# Patient Record
Sex: Female | Born: 1945 | Race: White | Hispanic: No | Marital: Married | Smoking: Never smoker
Health system: Southern US, Community
[De-identification: ages and names within clinical notes are randomized; demographics above are authoritative.]

## PROBLEM LIST (undated history)

## (undated) DIAGNOSIS — I10 Essential (primary) hypertension: Secondary | ICD-10-CM

## (undated) DIAGNOSIS — E785 Hyperlipidemia, unspecified: Secondary | ICD-10-CM

## (undated) DIAGNOSIS — N319 Neuromuscular dysfunction of bladder, unspecified: Secondary | ICD-10-CM

## (undated) DIAGNOSIS — I509 Heart failure, unspecified: Secondary | ICD-10-CM

## (undated) DIAGNOSIS — N289 Disorder of kidney and ureter, unspecified: Secondary | ICD-10-CM

## (undated) DIAGNOSIS — E119 Type 2 diabetes mellitus without complications: Secondary | ICD-10-CM

## (undated) DIAGNOSIS — IMO0001 Reserved for inherently not codable concepts without codable children: Secondary | ICD-10-CM

## (undated) DIAGNOSIS — E1140 Type 2 diabetes mellitus with diabetic neuropathy, unspecified: Secondary | ICD-10-CM

## (undated) DIAGNOSIS — K219 Gastro-esophageal reflux disease without esophagitis: Secondary | ICD-10-CM

## (undated) DIAGNOSIS — Z860100 Personal history of colon polyps, unspecified: Secondary | ICD-10-CM

## (undated) DIAGNOSIS — I252 Old myocardial infarction: Secondary | ICD-10-CM

## (undated) DIAGNOSIS — F329 Major depressive disorder, single episode, unspecified: Secondary | ICD-10-CM

## (undated) DIAGNOSIS — F32A Depression, unspecified: Secondary | ICD-10-CM

## (undated) DIAGNOSIS — K649 Unspecified hemorrhoids: Secondary | ICD-10-CM

## (undated) DIAGNOSIS — E11319 Type 2 diabetes mellitus with unspecified diabetic retinopathy without macular edema: Secondary | ICD-10-CM

## (undated) DIAGNOSIS — I739 Peripheral vascular disease, unspecified: Secondary | ICD-10-CM

## (undated) HISTORY — PX: SUPRAPUBIC CATHETER PLACEMENT: SHX2473

## (undated) HISTORY — PX: LAPAROSCOPIC CHOLECYSTECTOMY: SUR755

## (undated) HISTORY — PX: SACRAL DECUBITUS ULCER EXCISION: SUR512

## (undated) HISTORY — PX: OTHER SURGICAL HISTORY: SHX169

---

## 2002-11-30 ENCOUNTER — Encounter: Payer: Self-pay | Admitting: Family Medicine

## 2002-11-30 ENCOUNTER — Ambulatory Visit (HOSPITAL_COMMUNITY): Admission: RE | Admit: 2002-11-30 | Discharge: 2002-11-30 | Payer: Self-pay | Admitting: Family Medicine

## 2005-09-17 ENCOUNTER — Ambulatory Visit (HOSPITAL_COMMUNITY): Admission: RE | Admit: 2005-09-17 | Discharge: 2005-09-17 | Payer: Self-pay | Admitting: *Deleted

## 2006-06-24 ENCOUNTER — Inpatient Hospital Stay (HOSPITAL_COMMUNITY): Admission: EM | Admit: 2006-06-24 | Discharge: 2006-07-17 | Payer: Self-pay | Admitting: Emergency Medicine

## 2006-06-24 DIAGNOSIS — M869 Osteomyelitis, unspecified: Secondary | ICD-10-CM | POA: Insufficient documentation

## 2006-06-26 ENCOUNTER — Ambulatory Visit: Payer: Self-pay | Admitting: Internal Medicine

## 2006-06-26 ENCOUNTER — Encounter (INDEPENDENT_AMBULATORY_CARE_PROVIDER_SITE_OTHER): Payer: Self-pay | Admitting: *Deleted

## 2006-07-07 ENCOUNTER — Ambulatory Visit: Payer: Self-pay | Admitting: Physical Medicine & Rehabilitation

## 2006-07-07 ENCOUNTER — Ambulatory Visit: Payer: Self-pay | Admitting: Internal Medicine

## 2006-07-17 ENCOUNTER — Inpatient Hospital Stay (HOSPITAL_COMMUNITY)
Admission: RE | Admit: 2006-07-17 | Discharge: 2006-09-09 | Payer: Self-pay | Admitting: Physical Medicine & Rehabilitation

## 2006-08-07 ENCOUNTER — Ambulatory Visit: Payer: Self-pay | Admitting: Infectious Diseases

## 2006-08-28 ENCOUNTER — Encounter (INDEPENDENT_AMBULATORY_CARE_PROVIDER_SITE_OTHER): Payer: Self-pay | Admitting: Specialist

## 2006-10-21 ENCOUNTER — Ambulatory Visit: Payer: Self-pay | Admitting: Infectious Diseases

## 2006-10-21 DIAGNOSIS — I10 Essential (primary) hypertension: Secondary | ICD-10-CM | POA: Insufficient documentation

## 2006-10-21 DIAGNOSIS — I4891 Unspecified atrial fibrillation: Secondary | ICD-10-CM

## 2006-10-21 DIAGNOSIS — Z9079 Acquired absence of other genital organ(s): Secondary | ICD-10-CM | POA: Insufficient documentation

## 2006-10-21 DIAGNOSIS — Z9089 Acquired absence of other organs: Secondary | ICD-10-CM

## 2006-10-21 DIAGNOSIS — E119 Type 2 diabetes mellitus without complications: Secondary | ICD-10-CM

## 2006-10-21 DIAGNOSIS — A409 Streptococcal sepsis, unspecified: Secondary | ICD-10-CM | POA: Insufficient documentation

## 2006-10-21 DIAGNOSIS — M109 Gout, unspecified: Secondary | ICD-10-CM

## 2006-10-21 LAB — CONVERTED CEMR LAB
AST: 17 units/L (ref 0–37)
Albumin: 3.6 g/dL (ref 3.5–5.2)
Alkaline Phosphatase: 108 units/L (ref 39–117)
BUN: 16 mg/dL (ref 6–23)
CRP: 4.3 mg/dL — ABNORMAL HIGH (ref <0.6)
Calcium: 9.2 mg/dL (ref 8.4–10.5)
Glucose, Bld: 111 mg/dL — ABNORMAL HIGH (ref 70–99)
HCT: 38 % (ref 36.0–46.0)
MCHC: 30.5 g/dL (ref 30.0–36.0)
Platelets: 384 10*3/uL (ref 150–400)
Potassium: 4.9 meq/L (ref 3.5–5.3)
Sodium: 140 meq/L (ref 135–145)
Total Protein: 6.9 g/dL (ref 6.0–8.3)

## 2006-11-05 ENCOUNTER — Telehealth: Payer: Self-pay | Admitting: Infectious Diseases

## 2006-11-11 ENCOUNTER — Ambulatory Visit (HOSPITAL_COMMUNITY): Admission: RE | Admit: 2006-11-11 | Discharge: 2006-11-11 | Payer: Self-pay | Admitting: Infectious Diseases

## 2006-11-25 ENCOUNTER — Ambulatory Visit: Payer: Self-pay | Admitting: Infectious Diseases

## 2006-11-25 LAB — CONVERTED CEMR LAB
MCV: 85.8 fL (ref 78.0–100.0)
Platelets: 417 10*3/uL — ABNORMAL HIGH (ref 150–400)
Sed Rate: 91 mm/hr — ABNORMAL HIGH (ref 0–22)
WBC: 11.7 10*3/uL — ABNORMAL HIGH (ref 4.0–10.5)

## 2006-12-22 ENCOUNTER — Telehealth: Payer: Self-pay | Admitting: Infectious Diseases

## 2006-12-30 ENCOUNTER — Encounter: Payer: Self-pay | Admitting: Infectious Diseases

## 2006-12-31 ENCOUNTER — Ambulatory Visit (HOSPITAL_COMMUNITY): Admission: RE | Admit: 2006-12-31 | Discharge: 2006-12-31 | Payer: Self-pay | Admitting: Infectious Diseases

## 2006-12-31 ENCOUNTER — Encounter: Payer: Self-pay | Admitting: Infectious Diseases

## 2007-01-01 ENCOUNTER — Ambulatory Visit: Payer: Self-pay | Admitting: Infectious Diseases

## 2007-01-01 DIAGNOSIS — L89109 Pressure ulcer of unspecified part of back, unspecified stage: Secondary | ICD-10-CM | POA: Insufficient documentation

## 2007-01-01 LAB — CONVERTED CEMR LAB: Sed Rate: 85 mm/hr — ABNORMAL HIGH (ref 0–22)

## 2007-04-14 ENCOUNTER — Encounter: Payer: Self-pay | Admitting: Infectious Diseases

## 2007-10-26 ENCOUNTER — Telehealth: Payer: Self-pay | Admitting: Infectious Diseases

## 2009-07-05 ENCOUNTER — Ambulatory Visit: Payer: Self-pay | Admitting: Cardiology

## 2010-02-26 ENCOUNTER — Encounter: Admission: RE | Admit: 2010-02-26 | Discharge: 2010-02-26 | Payer: Self-pay | Admitting: Family Medicine

## 2010-06-02 ENCOUNTER — Encounter: Payer: Self-pay | Admitting: Infectious Diseases

## 2010-09-27 NOTE — Discharge Summary (Signed)
Kelsey Williams, Kelsey Williams                ACCOUNT NO.:  [id number]   MEDICAL RECORD NO.:  [id number]          PATIENT TYPE:  INP   LOCATION:  3032                         FACILITY:  [facility]   PHYSICIAN:  Lonia Blood, M.D.       DATE OF BIRTH:  1945-09-15   DATE OF ADMISSION:  06/24/2006  DATE OF DISCHARGE:                         DISCHARGE SUMMARY - REFERRING   INTERIM DISCHARGE SUMMARY:   PRIMARY CARE PHYSICIAN:  Corrie Mckusick, M.D., [place], [place]  [place].   DIAGNOSES:  For a complete list of diagnoses, refer to previously  dictated discharge summary done by Dr. Corky Downs.  In addition  1. Vancomycin-resistant enterococcus urinary tract infection.  2. 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  3. Sacral decubitus stage II.  4. Diskitis.  5. Funguria.  6. Severe protein caloric malnutrition.   MEDICATIONS:  Will be dictated at the time of the actual discharge of  the patient.   DISPOSITION:  Will be dictated at the time of the actual discharge of  the patient.   PROCEDURE:  For a complete list of procedures, refer to the previously  dictated discharge summary done by Dr. Corky Downs.  In addition  1. July 09, 2006, the patient underwent an abdominal x-ray with      findings of improving ileus.  2. July 11, 2006, CT scan of the pelvis was done that did not show      any sacral abscess.  3. July 13, 2006, MRI of the cervical, thoracic and lumbar spine with      findings of dural enhancement compatible with meningitis.  No      epidural fluid collection in the cervical spine.  Stable appearance      of the thoracic spine with diffuse dural thickening and enhancement      compatible with meningitis.  No focal epidural fluid collection in      the thoracic region.  Progressive right paraspinal inflammatory      changes in the lumbar region suggest L4-L5 diskitis, osteomyelitis      as the etiology of the presumed diffuse meningitis;  however, the      findings of the L4-L5 may be degenerative.  No drainable epidural      fluid collection.   HOSPITAL COURSE:  For hospital course covering June 24, 2006, until  July 08, 2006, refer to the previously dictated discharge summary  done by Dr. Corky Downs.   Hospital course from July 09, 2006, until July 14, 2006, during  this period of time [name] has been advanced by increasing her  level of physical activity.  Unfortunately the course of this part of  the hospitalization was complicated by development of a low grade  temperature.  This has been fully evaluated by obtaining a urine culture  as well as rescanning the patient's cervical, thoracic and lumbar spine.  It was noted that the patient's urine culture was positive for  vancomycin-resistant enterococcus and the patient's MRI of the spine was  positive for slight worsening of the paraspinal abscess.  Also, it  was  noted that the patient's dural enhancement on the MRI of the cervical,  thoracic and lumbar spine has persisted.  The patient's antibiotics were  adjusted at the discretion of the infectious disease consultant and  currently the patient is on Zyvox.  The plans are to switch the patient  to vancomycin intravenously and leave the patient on vancomycin for at  least six months.   PROBLEM #2 -  UNCONTROLLED DIABETES MELLITUS TYPE 2:  [name]'  Lantus and NovoLog was adjusted as needed during this period of  hospitalization to achieve good control.   PROBLEM #3 -  SEVERE LOWER EXTREMITY WEAKNESS AND DECONDITIONING  SECONDARY TO THE DURAL PROCESS:  [name]' physical therapy and  activity level was increased as tolerated and the patient is working  towards getting to the inpatient rehab floor.   PROBLEM #4 -  SACRAL DECUBITUS:  On July 08, 2006, it was noted that  the patient may have skin breakdown.  Further evaluation revealed a  stage II 11 x 6 cm pressure ulcer with a 60% area of  necrosis.  An  immediate CT scan of the pelvis was obtained which did not show an  abscess formation.  Patient was placed on a [place] bed and specialized  local wound care was obtained.      Lonia Blood, M.D.  Electronically Signed     SL/MEDQ  D:  07/14/2006  T:  07/14/2006  Job:  [id number]   cc:   Kelsey Williams, M.D.  Corrie Mckusick, M.D.  Nanetta Batty, M.D.

## 2010-09-27 NOTE — Consult Note (Signed)
NAMEKRISSIA, Kelsey Williams                ACCOUNT NO.:  [id number]   MEDICAL RECORD NO.:  [id number]          PATIENT TYPE:  INP   LOCATION:  3032                         FACILITY:  [facility]   PHYSICIAN:  Melissa L. Ladona Ridgel, MD  DATE OF BIRTH:  [birth year]   DATE OF CONSULTATION:  07/13/2006  DATE OF DISCHARGE:                                 CONSULTATION   REASON FOR PRESENTATION:  Assist with pain control.   REFERRING PHYSICIAN:  Lonia Blood, M.D.   CASE SUMMARY:  The patient is a 65 year old white female transferred  from [facility] in atrial fibrillation with rapid ventricular  response.  The patient had had a four-day history of worsening lower  back pain for which she was diagnosed and treated for a UTI.  After  starting oral antibiotics as an outpatient, the patient presented to  [facility] with worsening lower back pain and was found to be  in atrial fibrillation.  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  The  patient has been seen by cardiology for a non ST elevation MI as a  source for atrial fibrillation with rapid ventricular response.  She has  been treated appropriately and now is in normal sinus rhythm.  She was  evaluated by Dr. Newell Coral in infectious disease with regard to her  epidural inflammation and is being treated with IV antibiotics.  The  course is further complicated by ileus, diabetes with intermittently  poor control blood sugars.  Palliative medicine was asked to consult  regarding her pain management.   REVIEW OF SYSTEMS:  Pain in the lower back 4/5, currently described as  slicing in nature.  She does not have nausea or vomiting.  She had been  falling prior to admission and continues to feel weak.  She had no  recent weight loss or weight gain.   PAST MEDICAL HISTORY:  Insulin-dependent diabetes.   PAST SURGICAL HISTORY:  1. Umbilical herniorrhaphy.  2. Cholecystectomy in 2001 and partial hysterectomy.   SOCIAL HISTORY:  The patient lives with her husband and daughter.  She  does not smoke.  She does not drink.   FAMILY HISTORY:  Reviewed and is noncontributory.   MEDICATIONS:  Medications during this hospital stay:  1. Miconazole per vagina 2%.  2. Meperidine nasal daily.  3. Aspirin 325 mg daily.  4. Protonix 40 mg daily.  5. Lovenox 40 mg q.24h.  6. Lopressor 100 mg daily and 50 mg at bedtime.  7. Sliding scale insulin.  8. Irbesartan 150 mg daily.  9. Diflucan 100 mg daily.  10.Protein powder.  11.Fentanyl  to her wound.  12.Hydromorphone 0.5 mg IV q.4h p.r.n. as well as an order for 2 to 4      mg IV q.4 h. p.r.n.  13.Lantus 30 units q.12h.  14.Ceftriaxone 2 g IV q.12h.  15.Percocet one q.6h. p.r.n.   PHYSICAL EXAMINATION:  VITAL SIGNS:  Temperature 98.1, blood pressure  128/71, pulse  67, respirations 16, saturation 94%.  Blood sugar 108.  GENERAL:  This is a pleasant white female in no acute distress.  HEENT:  She is normocephalic, atraumatic.  Pupils are equal, round and  reactive to light.  Extraocular muscles were intact.  Mucous membranes  are moist.  NECK:  Supple. There is no JVD, no lymph nodes, no carotid bruits.  She  does have some tenderness in the posterior cervical spine area on  palpation and mild muscular tension.  CHEST:  Decreased but clear.  CARDIOVASCULAR:  Regular rate and rhythm.  Positive S1 and S2.  No S3 or  S4.  No murmurs, rubs or gallops.  ABDOMEN:  Soft, nontender, nondistended with positive bowel sounds with  mild obesity.  BACK:  Her sacral wound was dressed and was not examined during the this  visit.  The wound care evaluation was reviewed.  EXTREMITIES:  Shows scabs over the dorsum of her left foot and right  plantar area.   LABORATORY DATA:  CT scans were reviewed.  The MRI from today is  pending.  Her most recent CBC showed  white count of hemoglobin 10,  hematocrit 29, platelets 351,000.  Her most recent sodium was 130,  potassium 3.9, chloride 93, CO2 29, BUN 4, creatinine 0.31.  Her EXR is  139.  Urinalysis showed large leukocyte esterase and evidence for  infection.  Cultures are pending.  Most recent CT scan of the pelvis  showed degenerative changes in the lumbar spine.  No evidence for sacral  abscess.  MRSA was pending.   ASSESSMENT/PLAN:  The patient is a 65 year old white female admitted  with atrial fibrillation and back pain, found to have group B strep  bacteremia with lumbar epidural inflammatory process.  No formal abscess  is noted.  Her course has been complicated by atrial fibrillation during  this stay, with sacral decubitus as well as increasing pain.  Palliative  was asked to assist with her discomfort.   1. Pain control.  I would start a long-acting MS Contin agent at 15 mg      p.o. q.12h. and up titrate for better basilar control as needed.      For her acute pain at this time, I would continue Dilaudid for      breakthrough and attempt to convert that over to oral medications      as her pain is better controlled.  The patient is not sleeping and      the pain is worse at night.  I would add amitriptyline 50 mg at      bedtime.  I would consider adding Lyrica if the pain persists and      has more of a pain patch neuropathic quality.  Standing doses of      Tylenol may have significant effects on her basilar pain control,      I, therefore, would use that every four hours and discontinue her      Percocet.  2. Avoid constipation by adding Colace and using Dulcolax as needed.   We will reevaluate her pain in the a.m. and titrate medications  appropriately.   The total time spent with this patient was from 3:25 to 4:25.      Melissa L. Ladona Ridgel, MD  Electronically Signed     MLT/MEDQ  D:  07/16/2006  T:  07/16/2006  Job:  [id number]   cc:   Lonia Blood, M.D.

## 2010-09-27 NOTE — H&P (Signed)
NAMERIVERS, GASSMANN NO.:  [id number]   MEDICAL RECORD NO.:  [id number]          PATIENT TYPE:  INP   LOCATION:  1843                         FACILITY:  [facility]   PHYSICIAN:  Ulyses Amor, MD DATE OF BIRTH:  02/01/1946   DATE OF ADMISSION:  06/24/2006  DATE OF DISCHARGE:                              HISTORY & PHYSICAL   Kelsey Williams is a 65 year old white woman who was transferred from [facility] to [facility]  for further management of atrial  fibrillation with a rapid ventricular rate.  The patient, who has no  past history of cardiac disease, was started on medication for her  urinary tract several days ago.  In the intervening time, she has  experienced muscle spasms in her lower back.  She developed worsening  muscle spasm today and presented to the emergency department at [facility].  There she was found to be in atrial fibrillation with a  rapid ventricular rate.   As noted, the patient has no past history of cardiac disease, including  no history of chest pain, myocardial infarction, coronary artery  disease, congestive heart failure, or arrhythmia.  Her only risk factors  for coronary artery disease include hypertension and diabetes mellitus.  She reports no dizziness, lightheadedness, palpitations, syncope, or  near syncope.  Nor does she note any chest pain, tightness, heaviness,  pressure, or squeezing. The patient's only other medical problem is  gout.   MEDICATIONS:  Skelaxin, Cipro, oxycodone, acetaminophen.   ALLERGIES:  None.   OPERATIONS:  Appendectomy, cholecystectomy, hysterectomy.   SOCIAL HISTORY:  The patient lives with her husband and youngest  daughter.  She neither smokes cigarettes nor drinks alcohol.   FAMILY HISTORY:  Noncontributory.   REVIEW OF SYSTEMS:  No problems related to her head, eyes, ears, nose,  mouth, throat, lungs, gastrointestinal system, genitourinary system, or  extremities.   There is no history of neurological or psychiatric  disorder.  There is no history of fever, chills, or weight loss.   PHYSICAL EXAMINATION:  VITAL SIGNS:  Blood pressure 143/74, pulse 137  and irregularly irregular, respirations 18, temperature 97.2.  GENERAL:  The patient was an obese, middle aged white woman in no  discomfort.  She was alert, oriented, and responsive.  HEENT:  Normal.  NECK:  Without thyromegaly or adenopathy.  Carotid pulses were palpable  bilaterally and without bruits.  CARDIAC:  Irregularly irregular rhythm.  There was no murmur, rub,  gallop, or click.  CHEST:  No chest wall tenderness was noted.  LUNGS:  Clear.  ABDOMEN:  Soft, nontender.  There was no mass, hepatosplenomegaly,  bruit, distention, rebound, guarding, or rigidity.  Bowel sounds were  normal.  RECTAL/GENITAL:  Not performed as they were not pertinent to the reason  for this consultation.  EXTREMITIES:  Without edema, deviation, or deformity.  Radial and  dorsalis pedis pulses were palpable bilaterally.  NEUROLOGIC:  Brief  screening neurological survey was unremarkable.   LABORATORY DATA:  Electrocardiogram revealed atrial fibrillation with a  ventricular rate of 136 beats per minute.  There were nonspecific ST-T  wave changes, possibly related to the rate.  The chest radiograph report  was pending at that time of this dictation.  The initial CK was 171 with  a CK-MB of 7.4, relative index 4.3, and troponin 0.23.  Potassium was  2.6, BUN 17, creatinine pending.  White coun5 20.8, hemoglobin 12.8,  hematocrit 38.3.  The remaining studies were pending at the time of this  dictation.   IMPRESSION:  1. Atrial fibrillation with a rapid ventricular rate  2. Rule out myocardial infarction. Troponin 0.23.  3. Hypertension.  4. Diabetes mellitus.  5. Gout.  6. Recent urinary tract infection.   PLAN:  1. Telemetry.  2. Serial cardiac enzymes.  3. Aspirin.  4. Intravenous heparin.  5. Thyroid  function tests.  6. Echocardiogram.  7. IV diltiazem for rate control.  8. Further measures per Dr. Jacinto Halim.      Ulyses Amor, MD  Electronically Signed     MSC/MEDQ  D:  06/25/2006  T:  06/25/2006  Job:  [id number]   cc:   Cristy Hilts. Jacinto Halim, MD

## 2010-09-27 NOTE — Discharge Summary (Signed)
NAMEJARYIAH, Kelsey Williams                ACCOUNT NO.:  [id number]   MEDICAL RECORD NO.:  [id number]          Kelsey Williams TYPE:  IPS   LOCATION:  4003                         FACILITY:  [facility]   PHYSICIAN:  Ranelle Oyster, M.D.DATE OF BIRTH:  August 18, 1945   DATE OF ADMISSION:  07/17/2006  DATE OF DISCHARGE:  09/09/2006                               DISCHARGE SUMMARY   DISCHARGE DIAGNOSES:  1. Cervical to sacral spine infection with dural thickening and      enhancement around thoracic spine with interval destruction of      facet joints L3-4, L4-5, suggesting septic arthritis.  2. Sacral decubitus ulcer, I&D'd.  3. Diabetes mellitus type 2.  4. Hypertension.  5. Severe protein depletion malnutrition.  6. Paroxysmal atrial fibrillation.  7. Bilateral foot ulcers, resolved.  8. Pain control, much improved.   HISTORY OF PRESENT ILLNESS:  Kelsey Williams is a 65 year old female  with history of DM type 2, recent UTI, admitted to [facility]  February 13 with lower back pain due to muscle spasms and atrial  fibrillation with RVR.  Blood sugar was 449 at admission.  Kelsey Williams  was evaluated by Dr. Jacinto Halim.  Cardiac enzymes noted to be elevated.  Kelsey  Kelsey Williams was started on IV heparin for NSTEMI.  Kelsey Williams also noted to  have leukocytosis and chronic foot ulcers with blood cultures being  positive for group B strep.  Kelsey Williams developed colonic ileus  requiring n.p.o. status and treatment with Reglan.  Infectious disease  was consulted for input and MRI of back recommended for full workup.  This revealed abnormal enhancement, L2, L3, with bilateral pulmonary  process.  Antibiotics were changed to Rocephin.  Dr. Lajoyce Corners was consulted  for I&D of bilateral foot ulcers on February 20.  Dr. Newell Coral consulted  for input on spine infection and felt Kelsey Williams would require  antibiotics greater than 6 months.  [facility] consulted for input on  ileus and has been following along.  Kelsey Williams's  diet has slowly been  advanced and Kelsey Williams has been tolerating p.o. intake.  Kelsey Williams  developed a sacral decubitus ulcer 11 x 6 cm without any improvement,  was started on Santyl and pulse lavage on March 4.  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  Follow-up MRI on March 3 showed progress in right paraspinal  with inflammatory discitis, osteomyelitis, L4, L5.  Currently Kelsey  Kelsey Williams continues with diaphoresis with poor mobility.  Noted to have  issues with pain management and palliative care following and adjusting  pain medications.  She is noted to have depressed mood and most recent  complaints of headache.  ID recommends Zosyn through March 9, then  changing over to Rocephin through April 2 for 4 weeks of antibiotic  therapy.  Currently due to impairments in mobility and self-care, we are  consulted for further therapies.   PAST MEDICAL HISTORY:  1. DM type 2.  2. Diabetic foot  ulcers.  3. hypertension.  4. Gout.  5. A hysterectomy.  6. Appendectomy.  7. Cholecystectomy.  8. Umbilical hernia repair.  9. Recent falls x4.  10.Neuropathy.   ALLERGIES:  ACE.   FAMILY HISTORY:  Positive for diabetes and coronary artery disease.   SOCIAL HISTORY:  Kelsey Williams is married and lives in a 1-level home with  4-5 steps to entry.  Was independent prior to admission.  Does not use  any tobacco or alcohol.   HOSPITAL COURSE:  Kelsey Williams was admitted to rehab on July 17, 2006, for inpatient therapies to consist of PT/OT daily.  Past admission  she was maintained on Zyvox through March 9 and then we changed it over  to IV Rocephin.  Pulse lavage was continued daily for sacral decubitus  management and an air mattress has been maintained for pressure relief  measures.  Secondary to complaints of headaches that were reported to be  new, MRI of Kelsey brain  was done past admission on March 8.  This showed  no acute intracranial abnormality, age-advanced atrophy with mild white  matter changes likely due to sequelae of chronic microvascular ischemia.  Weekly CBCs have been done per Dr. Earl Gala input as well as weekly  ESR.  Kelsey Williams's white counts have been variable.  Initially they did  drop down from 15 to 11.  Currently most recent CBC of April 28 shows  increase at 18.5.  Kelsey Williams did develop anemia with a drop in her H&H  to 8.5 on April 10.  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  She was transfused with 2 units of packed red blood  cells on August 31, 2006.  Most recent CBC of April 28 reveals H&H of  11.0 and 33.4, platelets at 690.  Follow-up MRI of lumbar, thoracic,  cervical spine done on March 28.  This showed progression in amount of  dural thickening and enhancement beginning at C4-5, extending all Kelsey  way to Kelsey lumbosacral junction, causing some compression of spinal  cord, particularly at C7-T1 and now with some bone marrow edema and  enhancement of posterior vertebral bodies at C6 and C7 and all of  thoracic and lumbar vertebrae, probably due to Kelsey active edema from  infection and or infection of Kelsey bone, progression of enhancement of  paraspinal soft tissues, significant progression of circumferential and  dural thickening and enhancement of Kelsey entire thoracic spine, and no  epidural abscess seen.  Progression of dural thickening and enhancement  throughout lumbar spine with enhancement of Kelsey posterior vertebral body  in lumbar spine with some interval destruction of facet joints at right  L3-4, L4-5, suggesting septic arthritis, question early epidural  abscess, posterior L4-5, but most likely represents thickened ligaments.  There was diffuse myositis on posterior paraspinous muscle.  A right psoas muscle abscess had improved from prior study.  Radiology  questioned whether this  was an atypical infection such as fungus or  whether this had not been adequately treated with antibiotics.  Dr.  Newell Coral was consulted for input and recommended getting input from ID  as Kelsey radiologist believed Kelsey Williams appeared to have a progressed  infection by MRI despite 6 weeks of antibiotics.  ESR without  improvement at 110.   ID has been following up on Kelsey Williams.  Dr. Orvan Falconer evaluated Kelsey  Kelsey Williams and felt as Kelsey Williams has improved clinically and  microbiologically on treatment for group B  strep bacteremia with  vertebra infection.  He felt that Kelsey Williams needs to continue on  Rocephin and anticipates that her MRI would be abnormal long after Kelsey  infection is cleared.  He recommended continuing antibiotics for 8-12  weeks total plus-minus additional course of p.o. antibiotics such as  amoxicillin in Kelsey future.  Kelsey Williams has improved neurologically with  improvement in lower extremity strength.  Her fever curve has been  variable.  Kelsey Williams had had some fever spikes of 101.  Blood  cultures, urine cultures done, and urine cultures continued to show VRE.  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  Rocephin was increased to  2 g IV daily on April 15.  Kelsey Williams's sacral decubitus has been  managed with use of hydrotherapy 5 days a week in addition to Accuzyme  for surgical debridement and some local debridement at site.  Kelsey  Kelsey Williams has had some improvement, however was noted to have some  increase in drainage and increase in ischemic areas around edges.  General surgery was consulted for input regarding I&D.  Kelsey Williams was  taken to OR for debridement of necrotic tissue on April 18 by Dr.  Zachery Dakins.  Wound cultures grew out Streptococcus viridans.  Kelsey  Kelsey Williams's fever curve was noted to be trending down at this surgery.  Kelsey Williams was changed over to IV Primaxin 500 mg q.8h. on April  19 by  Dr. Roxan Hockey.  Currently sacral decubitus is treated with Monday,  Wednesday, Friday Wound-V.A.C. with hydrotherapy prior to re-application  of V.A.C.  Local bedside I&D was done additionally on April 28 by Dr.  Gerrit Friends.  They felt no further debridement is needed at present as no  odor, no drainage, and sacral decubitus revealing only granulation site.   Initially pain control was an issue.  Kelsey Williams was on MSIR as well as  fentanyl patch.  She did have excessive sedation secondary to these  medications.  A narcotic holiday was carried out with improvement in Kelsey  Kelsey Williams's mental status.  As mental status improved, pain medicines were  slowly added back on.  Currently Kelsey Williams's pain is well-managed with  Kelsey use of Tylenol 500 mg q.6h., with OxyIR 5-10 mg q.4-6h. p.r.n. pain.  Kelsey Williams's p.o. intake was noted to be poor.  Megace was added with much improvement in her appetite.  With improvement in her appetite,  blood sugar was noted to be on an upward trend, 140s to 290s.  Currently  Kelsey Williams's glyburide is increased to 10 mg a day with Glucophage  continuing at 850 mg in Kelsey a.m. and 500 mg in Kelsey p.m.  Protein  supplements are being used on a b.i.d. basis in addition to Juven b.i.d.  for low albumin stores and to help assist with wound healing.  Kelsey  Kelsey Williams is noted to have neurogenic bowel with minimal rectal tone and  is noted to be incontinent.  A bowel program has continued to be  adjusted to help with this.  Currently Kelsey Williams's bowel program has  been changed to Fibercon two tablets at 10 p.m., Dulcolax suppository  daily at 5 a.m. followed by digital stimulation to help empty.  Lactinex  is being used one packaged b.i.d. secondary to current antibiotic use.  Vitamin C 500 mg b.i.d. as well as zinc sulfate 220 mg b.i.d. was added  additionally for wound healing.  IV Primaxin continues currently at 500  mg q.8h.  ID recommends 4 weeks of imipenem, which  would be  approximately through May 19.  They also recommend changing Kelsey Williams  over to ertapenem 1 g per day for ease of care.  Kelsey Williams to follow up  with ID past discharge regarding further input on treatment of infection  past antibiotic completed.  Dr. Newell Coral has been contacted prior to  discharge and felt Kelsey Williams not a surgical candidate, does not need  surgery currently.  She could be managed on outpatient basis by  infectious disease when put on antibiotic as well as primary care for  medical issues.   Most recent labs on April 28 reveal sodium 138, potassium 4.6, chloride  107, CO2 25, BUN 11, creatinine 0.34, glucose 155, albumin 2.3.  LFTs  within normal limits.  Blood pressures checked on b.i.d. basis have been  normal from 120s to a rare high at 140 systolic, 60s to 16X diastolic.  Blood sugars from 140s to high 200 range.  Foley remains in place  secondary to sacral decubitus ulcer.  Therapies have been ongoing and  Kelsey Williams's participation and mood have improved greatly.  She was  started on Lexapro 5 mg, and this has increased to 10 mg a day.  Kelsey  Kelsey Williams's motivation as well as overall outlook has greatly improved.  Currently Kelsey Williams is at minimum assist for toileting.  She is able  to tolerate bathing, dressing at head of bed with setup at minimum  assist.  Requires minimum to maximum assist with ADL's.  She is able to  stand for a total of 90 seconds on standard walker without break with  focus on standing balance, safety and awareness and functional strength.  She is able to transfer to a wheelchair and other level surfaces with  scoot, pivot transfers at minimum assist.  Kelsey Williams continues to lack  active knee extension bilaterally, left being greater than right.  She  is currently able to transfer wheelchair with supervision at 450 feet.  Speech therapy has also been working on Kelsey Williams for cognitive issues. Kelsey Williams's base is at 20% accuracy,  minimum assist for simple daily  math problems.  Kelsey Williams is 100% accurate for complex-level  conversation, independent for verbal expression.  Reading comprehension  is intact and writing expression is functional.  Supervision level for  emergent awareness.  Supervision to independent for integrated  functional tasks.  Requiring minimum assist for complex planning tasks.  Kelsey Williams will continue to require further progressive PT/OT past  discharge.  Kelsey Wound-V.A.C. will continue with hydrotherapy 3 times a  week with V.A.C. changes.  Continue pressure relief measures with air  mattress for healing of wound and continue Foley to straight drain until  sacral decubitus healed up.   DISCHARGE MEDICATIONS:  1. Lexapro 10 mg p.o. daily.  2. Glyburide 10 mg p.o. daily.  3. Lopressor 100 mg b.i.d.  4. K-Dur 20 mEq a day.  5. Multivitamin once a day.  6. Coated aspirin 325 mg a day.  7. Eucerin cream to bilateral feet b.i.d.  8. Protonix 40 mg a day.  9. Lantus insulin 30 units subcu q.12h.  10.Tylenol 500 mg at 6 a.m., 1200 hours, 1800 hours, and 2400 hours.  11.Glucophage 850 mg in Kelsey a.m., 500 mg in p.m.  12.Protein powder supplements b.i.d.  13.Juven nutritional drink b.i.d.  14.Lactinex one package b.i.d.  15.Fibercon two tablets p.o. at 2200 hours.  16.Megace 400 mg p.o. b.i.d.  17.Vitamin C 500 mg b.i.d.  18.Zinc sulfate 220 mg b.i.d.  19.Glyburide 10 mg p.o. b.i.d.  20.Primaxin 500 mg IV q.8h. or ertapenem 1 g IV daily.  21.OxyIR 5-10 mg q.4-6h. p.r.n. pain.  22.Maalox Plus 30 mL p.o. q.4h. p.r.n.  23.Dulcolax suppository q.a.m. at 5 a.m.  24.Ambien 5 mg q.h.s. p.r.n.  25.Phenergan 12.5 mg q.6h. p.r.n.   DIET:  Carb-modified medium.   SPECIAL INSTRUCTIONS:  1. Progressive PT/OT to continue past discharge.  2. Continue monitoring diabetes with a.c. and h.s. CBG checks and      sliding scale insulin per protocol for elevated blood sugars.  3. Continue pressure relief  measures with air mattress.  4. Continue Foley to straight drain for now.  5. Continue wound care management with Wound-V.A.C. changes Monday,      Wednesday, Friday with hydrotherapy.  Would not require pulse      lavage.  For any changes or further changes in output, appearance,      and increasing of necrotic tissue, please contact general surgery      for debridement.   FOLLOW-UP:  Kelsey Williams to follow up with LMD for medical management.  Follow up with infectious disease for input regarding further antibiotic  treatment, changing over to p.o. antibiotics, as well as Kelsey need for  repeat x-rays and labs in Kelsey future.  Follow up with Dr. Riley Kill as  needed.      Greg Cutter, P.A.      Ranelle Oyster, M.D.  Electronically Signed    PP/MEDQ  D:  09/08/2006  T:  09/08/2006  Job:  [id number]   cc:   Lacretia Leigh. Ninetta Lights, M.D.  Nadara Mustard, MD  Cristy Hilts. Jacinto Halim, MD  Anselm Pancoast. Zachery Dakins, M.D.  Hewitt Shorts, M.D.

## 2010-09-27 NOTE — Consult Note (Signed)
[name], Williams                ACCOUNT NO.:  [id number]   MEDICAL RECORD NO.:  [id number]          PATIENT TYPE:  IPS   LOCATION:  4003                         FACILITY:  [facility]   PHYSICIAN:  Hewitt Shorts, M.D.DATE OF BIRTH:  08-06-1945   DATE OF CONSULTATION:  08/07/2006  DATE OF DISCHARGE:                                 CONSULTATION   NEUROSURGERY CONSULTATION:   HISTORY OF PRESENT ILLNESS:  The patient is a 65 year old white female  who was admitted about 6 weeks ago with a number of significant medical  problems.  Patient was seen in neurosurgery consultation early in that  admission and this is a reconsultation at the request of Dr. Faith Rogue.   Patient was admitted with an acute subendocardial myocardial infarction  with atrial fibrillation with rapid ventricular response, a history of  diabetes mellitus with profound hyperglycemia at the time of  hospitalization and complaints of low back pain.   The patient was also noted to have nonhealing diabetic foot ulcers.  Her  blood sugar was greater than 400, her white blood cell count was about  25,0000.  Blood cultures were performed and grew out group B strep.   MRI of the lumbar spine was obtained, which revealed diffuse epidural  infection and further MRIs were obtained of the cervical and thoracic  spine, which showed the infectious process extending through the  thoracic and lumbar regions and the inflammatory changes extending up  into the cervical region.  However, distinct abscess was not revealed.   Antibiotic therapy had been initiated.  The patient was seen in  infectious disease consultation and treatment for the infectious process  was suggested by the infectious disease service and she is continued to  be followed by them.   The patient was seen in neurosurgical consultation, but it was not felt  that there was a role for neurosurgical intervention because of the  diffuse distribution infection  and the lack of a focal abscess.   The patient has been treated over the past 6 weeks or so and her white  blood cell count decreased at one point to a normal range of about 8.5,  but is now most recently 12.6 on March 27.  However, sed rate has  remained elevated and the most recent sed rate on March 24 was 110.   The direction and supervision of the treatment of her infection has been  deferred to the expertise of the infectious disease service.   Patient was subsequently transferred from the incompass hospitalist  service to the rehabilitation service where she is now under the care of  Dr. Faith Rogue.   Patient was restudied today with MRI scans of the cervical, thoracic and  lumbar spine, all done without and with Gadolinium.  This was reviewed  with Dr. Elie Goody and Dr. Francene Boyers and the study is felt by them  to show progression of the process with diffuse thickening of the dura  extending from the C5 level to the sacrum throughout the lower cervical,  the entire thoracic and the entire lumbar region  with inflammation  extending into the posterior aspect of the adjacent vertebral bodies  from the cervical spine to the sacrum, as well as into the paraspinal  facial tissues.   In speaking with the patient, she reports some discomfort, but overall  feels that she is less uncomfortable.  She describes some discomfort in  her neck, in her mid back, as well as in her low back.  She continues to  undergo rehabilitation and therapies.   It is reported that she can transfer, with assistance, from bed to chair  and can stand with a walker briefly.   Neurosurgical reconsultation was requested by Dr. Faith Rogue who is  also contacting Dr. Cliffton Asters from the infectious disease service  and reported to him the MRI findings and his requested further  infectious disease service followup.   EXAMINATION:  GENERAL:  Patient is a white female, in no acute distress,   sitting in a wheelchair in the physical therapy gym.  VITAL SIGNS:  Temperature is 97.  Pulse 95.  Blood pressure 144/82.  MOTOR EXAMINATION:  Shows 5/5 strength through the upper extremities in  the deltoid, biceps, triceps, intrinsic grip bilaterally.  The iliopsoas  the left is 4- to 4, the right is 4-.  The quadriceps, the left is 4+,  the right is 4.   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  Sed rate has not improved.   RECOMMENDATIONS:  At this time, there remains no indication for  neurosurgical intervention.  The process is quite diffuse extending from  the mid cervical level to the sacrum and there does not appear to be  focal abscess collection.   However, I do agree with infectious disease service consultation  followup.  They and/or the medical service may need to reevaluate the  overall nature of this infectious process, which may require further  study, continued therapy and/or adjustments to the therapy and this will  be deferred to the infectious disease and medical service.      Hewitt Shorts, M.D.  Electronically Signed     RWN/MEDQ  D:  08/07/2006  T:  08/08/2006  Job:  [id number]

## 2010-09-27 NOTE — Consult Note (Signed)
Kelsey Williams, GRADILLAS                ACCOUNT NO.:  [id number]   MEDICAL RECORD NO.:  [id number]          PATIENT TYPE:  INP   LOCATION:  1843                         FACILITY:  [facility]   PHYSICIAN:  Kelsey Williams, M.D.DATE OF BIRTH:  29-May-1945   DATE OF CONSULTATION:  DATE OF DISCHARGE:                                 CONSULTATION   PRIMARY CARE PHYSICIAN:  Dr. Phillips Williams in [place].   REFERRING PHYSICIAN:  Dr. Jenne Williams.   REASON FOR REFERRAL:  Management of fever, body aches, and leukocytosis.   HISTORY OF PRESENTING COMPLAINT:  Kelsey Williams is a 65 year old Caucasian  female with a history of diabetes mellitus.  She started becoming sick  about 4 days ago when she became unsteady on her feet and fell about 4  times in one day.  She eventually went to her PCP yesterday, and she was  treated for a urinary tract infection, started on ciprofloxacin.  Later  yesterday evening, she went to the emergency room at [facility].  She was found to be in atrial fibrillation with rapid  ventricular response and had elevated troponin, hence she was sent over  to [facility] for cardiology evaluation.  The patient has been  having fever, chills, and body aches for about 4 days.  We are being  asked to evaluate.   She denies dysuria, urgency, or increased frequency of micturition,  although her UA was somewhat positive for urinary tract infection.  She  does have a cough, and she attributes this to Lisinopril.  There is no  chest pain.  Shortness of breath has increased from baseline.  She also  has complained of abdominal pain which is generalized.  No vomiting but  she has been nauseous.  There is no diarrhea or constipation.  There is  no sore throat or history of contact with somebody that is sick at home.  The patient has been able to tolerate her food.   REVIEW OF SYSTEMS:  As in the HPI.  In addition, the patient denies  headaches or change in vision, no focal weakness.   She has been having  some blisters on her skin which she has been picking.   PAST MEDICAL HISTORY:  Diabetes mellitus.   PAST SURGICAL HISTORY:  1. Appendectomy.  2. Cholecystectomy.  3. Partial hysterectomy.  4. Umbilical herniorrhaphy.   CURRENT MEDICATIONS:  1. Glyburide and metformin 5/500 p.o. b.i.d.  2. Ciprofloxacin which was recently started yesterday.  3. Aspirin 325 mg daily.  4. Ciprofloxacin 500 mg b.i.d.  5. Heparin infusion.  6. NovoLog sliding scale.  7. Skelaxin 800 mg t.i.d.  8. Lopressor 25 mg t.i.d.  9. IV fluids normal saline at 100 cc/hr.  10.Cardizem infusion.   ALLERGIES:  NO KNOWN DRUG ALLERGIES.   FAMILY HISTORY:  Noncontributory.   SOCIAL HISTORY:  The patient does not smoke cigarettes or drink alcohol.  She is retired from [employer].   PHYSICAL EXAMINATION:  VITAL SIGNS:  Currently, blood pressure 127/71,  temperature of 97.5, pulse of 87, respiratory rate of 20, O2 sat of 98%.  GENERAL:  The patient is awake, alert, oriented in time, place, and  person.  Not in respiratory distress.  HEENT:  Normocephalic atraumatic head.  Not icteric.  Not pale.  Mucous  membranes moist.  There is thrush.  No elevated JVD.  No carotid bruit.  LUNGS:  No wheeze or rhonchi.  CV:  S1 S2 irregular.  No murmur.  ABDOMEN:  Obese.  Slightly distended.  Soft.  There is diffuse  tenderness with voluntary guarding.  Bowel sounds present.  No palpable  organomegaly.  EXTREMITIES:  With 1+ pitting, pedal edema.  No calf tenderness.  SKIN:  Multiple skin lesions/infection on anterior abdominal wall and  lower extremities.  She has callus on the sole of her left foot.  CNS:  No focal neurological deficit.   INITIAL LABORATORY DATA:  Blood culture drawn yesterday at 1605 hours,  was positive for gram positive cocci in chains.  A second set of blood  cultures was negative.  She has had elevated troponin and CK-MB with  first set.  Her last set troponin 0.43, a CK-MB  of 75, relative index of  5.7, and a creatinine kinase of 1.322.  Urinalysis showed moderate  blood, urine glucose greater than 1,000.  Microscopy showed many  bacteria with 0-2 white blood cells, uric acid crystals present.  Fasting lipid profile:  Total cholesterol 134, HDL 25, LDL 82.  Hemoglobin A1c 13.5.  Sodium 126, potassium 3.4, chloride 99, CO2 14,  glucose 449, BUN 27, creatinine 0.96.  Total bilirubin 1.5, alkaline  phosphatase 163.  AST 68, ALT 52, total protein 6, albumin 2, calcium  8.6.  White cells 26.4, hemoglobin 13.2, hematocrit 29.6, platelets 310,  MCV 85.8.  TSH 0.197.  Repeat B-MET at 9:51 showed BUN of 26 and  creatinine of 0.83, a CO2 of 17, anion gap of 12.   IMAGING STUDIES:  CT scan of the abdomen and pelvis, chest CT pending.  EKG showed atrial fibrillation with rapid ventricular response, rate of  136, accompanied with nonspecific ST abnormality probably rate related.   ASSESSMENT/PLAN:  1. Diabetes mellitus, type 2, uncontrolled with probable diabetic      ketoacidosis.  This may be responsible for the generalized body      aches.  The patient will be started on Glucomander infusion and      check B-MET every four hours.  Please see diabetic ketoacidosis      order sheet for orders.  Of note is that she has not been compliant      with her medications because of cost issues.  The patient will need      to be on insulin therapy.  2. Urinary tract infection.  I agree with ciprofloxacin pending urine      culture.  3. Positive blood culture.  This may probably be contamination since      it is one out of two bottles.  We will repeat another set of blood      cultures now and start on vancomycin pending final data.  4. Multiple skin infections.  There is no open wounds for wound care      at this point.  We will continue antibiotics.  5. Non-ST elevation myocardial infarction.  Management per cardiology. 6. New onset atrial fibrillation, management as per  cardiology.  7. Hypokalemia.  We will replete with potassium chloride in      intravenous fluids.  8. Hypernatremia.  This is probably most likely related to  hyperglycemia, pseudo-natremia.  We will anticipate correction with      correction of blood glucose.  9. Low TSH.  We will check free T4 and repeat TSH.  10.Oral thrush.  Start Diflucan 200 mg x1 now, then 100 mg daily.  11.Abnormal transaminases.  Check hepatitis profile and await      abdominal CT scan.  The patient is status post cholecystectomy.   Thank you for the opportunity to see this patient.  We will follow with  you.      Kelsey B. Corky Downs, M.D.  Electronically Signed     MBB/MEDQ  D:  06/25/2006  T:  06/25/2006  Job:  [id number]   cc:   Kelsey Williams, Dr.  Phillips Williams, Dr.

## 2010-09-27 NOTE — Discharge Summary (Signed)
Kelsey Williams, Kelsey Williams                ACCOUNT NO.:  [id number]   MEDICAL RECORD NO.:  [id number]          PATIENT TYPE:  INP   LOCATION:  2027                         FACILITY:  [facility]   PHYSICIAN:  Mobolaji B. Bakare, M.D.DATE OF BIRTH:  10-18-1945   DATE OF ADMISSION:  06/24/2006  DATE OF DISCHARGE:                               DISCHARGE SUMMARY   INTERIM DISCHARGE SUMMARY:   PRIMARY CARE PHYSICIAN:  Corrie Mckusick, M.D.   FINAL DIAGNOSES:  1. 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  2. Sepsis.  3. Colonic ileus.  4. Non-ST elevation myocardial infarction.  5. Diabetes mellitus with diabetic ketoacidosis.  6. Diabetic foot ulcer, status post debridement.  7. Paroxysmal atrial fibrillation.  8. Wide complex tachycardia.   CONSULTS:  1. Cardiology, provided by [facility], Dr. Allyson Sabal.  2. Neurosurgical consult, Dr. Newell Coral.  3. Infectious disease, Dr. Orvan Falconer.  4. GI, Dr. Juanda Chance.  5. Orthopedics, Dr. Lajoyce Corners.   PROCEDURES:  1. CT of the abdomen, done on June 25, 2006, showed colonic ileus      and constipation.  2. Pelvic CT showed umbilical hernia, containing fat, without acute      abnormality.  3. Chest CT scan showed bibasilar atelectasis/pneumonia and tiny      pleural effusions bilaterally.  4. Abdominal x-ray done on February 15th showed dilated colon, likely      dilated ileus with stool in the right colon.  5. Ultrasound of the abdomen done on 15th of February showed liver is      echogenic, suggesting fatty liver.  No sign of focal lesion or      intrahepatic ductal dilatation.  The gallbladder has been removed.      CBD measures 8 mm.  6. Chest x-ray done on February 16th showed increased interstitial      markings in the lung bases and fullness in the right hilum.      Differential included infection versus asymmetric edema.  7. MRI of the left foot without contrast showed  ulceration along the      skin of the middle ball of the foot.  There is subcutaneous      enhancement in the vicinity, likely reflecting surrounding      cellulitis, subcutaneous and pressure edema noted within the foot      in a generalized fashion, which may represent early fascitis or      myositis or passive edema.  There was no abscess or osteomyelitis      noted.  8. 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  There was      enhancement along the posterior epidural, extending from the L3      level cranially to T6 level.  Soft tissues for epidural abscess.  9. MRI of the cervical spine showed dural thickening and enhancement.  Extends to the mid C2 level without involvement of the brainstem.      No definite epidural process visualized in the cervical spine.  10.Multiple abdominal x-rays done to follow up colonic ileus.  11.A 2D echocardiogram done on June 26, 2006, showed left      ventricular systolic function to be normal.  There was no regional      wall motion abnormalities.  There was moderate asymmetric septal      hypertrophic, mild-to-moderate aortic sclerosis.   HISTORY OF PRESENT ILLNESS:  Kelsey Williams is a 65 year old Caucasian  female who was initially seen at [facility] on June 24, 2006.  She was in atrial fibrillation with rapid ventricular response.  She was sent to [facility] for further evaluation by  cardiology.  She was initially admitted on the cardiology service by Dr.  Waldon Reining.  The patient has no past medical history of cardiac disease.  She was apparently started on medication for a urinary tract infection a  few days prior to the hospitalization.  She presented with fever, body  aches.  She had leukocytosis of 20,000 on admission.  She had a positive  UA.  She had elevated  troponins.  EKG showed atrial fibrillation with  rapid ventricular response.  She was acidotic with low bicarb on  admission with anion gap of 12-14.  Patient was admitted to the  cardiology service, and a medical consult was obtained.   HOSPITAL COURSE:  1. Sepsis with group B streptococcal infection and epidural      inflammation:  Patient was initially started on ciprofloxacin IV      for urinary tract infection.  Within 24 hours, blood culture came      back with gram-positive cocci in chains.  Vancomycin was added to      treatment.  Final blood culture report came back growing group B      streptococcus (strep agalactiae).  Urine culture, no growth.      Infectious disease consult was obtained.  The patient complained of      back pain.  This was evaluated by MRI of the lumbar, cervical, and      thoracic region.  Results are as noted above.  It significantly      showed an inflamed dura without definite abscess.  Neurosurgical      consult was obtained.  The patient's antibiotic was changed to      Rocephin.  She was followed up by Dr. Newell Coral during the course of      the hospitalization.  There was no surgical procedure indicated at      this point.  The patient improved slowly with IV antibiotics.  It      is recommended that she complete six weeks of IV Rocephin.  She has      a PICC line.  Severe back pain responded to analgesia.  Headaches      also resolved at the time of dictation.   1. Non-ST elevation MI:  Patient was seen in consultation by      cardiology.  She was medically managed.  Given the septicemia,      bacteremia, and sepsis, further intervention or workup was not      performed.  It was decided to manage medically until infection is      under control, and the patient will be seen at the office and would      follow up with Dr. Jenne Campus  in [place].  Patient is currently on      aspirin, lisinopril, metoprolol.  1. Paroxysmal atrial fibrillation:  She  was admitted with paroxysmal      atrial fibrillation with rapid ventricular response.  Patient was      initially started on Cardizem infusion.  Heart rate was controlled.      She subsequently converted to normal sinus rhythm, and she has      remained in normal sinus rhythm at the time of dictation.  Patient      will continue with Lopressor.  TSH was normal.  Chronic      anticoagulation is not indicated at this point.  Patient is on      aspirin.  Patient had wide complex tachycardia with several runs of      nonsustained VT, and this has now recurred again.   1. Colonic ileus:  Patient upon admission had a CT scan which was      suggestive of ileus and also a lot of stool.  Her abdomen continued      to get distended, and GI consult was obtained.  Abdominal      examination was tympanitic.  Patient was kept n.p.o.  A rectal tube      was put in place.  She was given a laxative.  She had several      abdominal x-rays.  She improved slowly.  It was felt that narcotic      analgesia also did not help the situation; however, she did need to      have narcotics to relieve back pain.  Patient has gradually      improved.  She was placed on TNA while n.p.o.  She is now off TNA,      and the patient is tolerating diet.  She is moving her bowels.   1. Diabetes mellitus:  This was uncontrolled.  Initial hemoglobin A1C      was greater than 13.  Further control was very difficult during      this hospitalization because of the TNA.  She was on high-dose      regular insulin while on TNA and also on q.12h. Lantus with q.4h.      sliding-scale insulin and no coverage.  In any case, her glucose      started to improve with discontinuation of TNA.  We are still      trying to achieve control at this point.   1. Diabetic foot ulcer:  She was seen by Dr. Lajoyce Corners.  He debrided the      wound.  The patient is on orthotic shoe.  Patient should follow up      with Dr. Lajoyce Corners in the office upon  discharge.   DISPOSITION:  Patient is deconditioned and needs further inpatient  rehabilitation or short-term skilled nursing facility placement.  This  is pending at this time.   An addendum to this dictation will be made at the time of discharge with  discharge medications.      Mobolaji B. Corky Downs, M.D.  Electronically Signed     MBB/MEDQ  D:  07/08/2006  T:  07/08/2006  Job:  [id number]   cc:   Corrie Mckusick, M.D.  Hedwig Morton. Juanda Chance, MD  Nanetta Batty, M.D.  Cliffton Asters, M.D.  Hewitt Shorts, M.D.  Nadara Mustard, MD

## 2010-09-27 NOTE — Discharge Summary (Signed)
NAMEJELENA, Kelsey Williams                ACCOUNT NO.:  [id number]   MEDICAL RECORD NO.:  [id number]          PATIENT TYPE:  INP   LOCATION:  3032                         FACILITY:  [facility]   PHYSICIAN:  Lonia Blood, M.D.       DATE OF BIRTH:  11-Sep-1945   DATE OF ADMISSION:  06/24/2006  DATE OF DISCHARGE:  07/17/2006                               DISCHARGE SUMMARY   PRIMARY CARE PHYSICIAN:  Dr. Assunta Found.   For a complete list of discharge diagnoses, refer to the previous two  dictated discharge summaries done on July 14, 2006, and on July 08, 2006.   DISCHARGE MEDICATIONS:  1. Bactroban applied daily.  2. Aspirin 325 mg daily.  3. Protonix 40 mg daily.  4. Lovenox 40 mg subcutaneously every 24 hours for DVT prophylaxis.  5. Barrier cream for the sacrum.  6. Insulin sliding scale 3 times a day before each meal.  7. Avapro 100 150 mg daily.  8. Beneprotein one scoop 3 times a day.  9. Santyl to the wound daily.  10.Multivitamin one tablet daily.  11.Elavil 50 mg at bedtime.  12.Colace 100 mg twice a day.  13.Tylenol 650 mg every 4 hours.  14.Lantus 32 units every 12 hours.  15.Lopressor 100 mg twice a day.  16.MS Contin 15 mg at bedtime.  17.MS Contin 30 mg in the morning.  18.Lyrica 75 mg twice a day.  19.Zyvox 600 mg intravenously until July 19, 2006.  20.Rocephin 1 gram intravenously every 24 hours to start on July 19, 2006.  21.Ultram 50 mg by mouth every 6 hours as needed for pain.  22.Dilaudid 0.5 mg intravenously every 4 hours as needed for pain.   DISPOSITION:  [name] is currently getting transferred to the  rehabilitation unit of [facility] where she will continue  specialized care, and she will follow up with Dr. Newell Coral from  Neurosurgery as well as with infectious disease consultants.  For all of  the pertinent findings during this hospitalization, refer to the  previously dictated two discharge summaries for hospital course from  July 14, 2006, until July 17, 2006.  During this period  of time, the patient had a progressive course of improvement with  increased ability to ambulate.  The patient did not require any more  procedures or any more diagnostic studies.  Her insurance company  approved transfer to the inpatient rehabilitation unit, and the patient  is going there on July 17, 2006.      Lonia Blood, M.D.  Electronically Signed     SL/MEDQ  D:  07/17/2006  T:  07/17/2006  Job:  [id number]

## 2010-09-27 NOTE — Consult Note (Signed)
Kelsey Williams, Kelsey Williams                ACCOUNT NO.:  [id number]   MEDICAL RECORD NO.:  [id number]          PATIENT TYPE:  IPS   LOCATION:  4003                         FACILITY:  [facility]   PHYSICIAN:  Anselm Pancoast. Weatherly, M.D.DATE OF BIRTH:  [birth year]   DATE OF CONSULTATION:  08/27/2006  DATE OF DISCHARGE:  06/24/2006                                 CONSULTATION   Primary care physician:  Dr. Kandyce Rud.  Rehab physician:  Ranelle Oyster, MD  Cardiologist:  Cristy Hilts. Jacinto Halim, MD  Infectious disease:  Lacretia Leigh. Ninetta Lights, MD   REASON FOR CONSULTATION:  Nonhealing stage IV sacral decubitus.   HISTORY OF PRESENT ILLNESS:  Kelsey Williams is a 65 year old female patient  who was admitted to the rehab unit on July 17, 2006, after a prolonged  hospitalization during February.  She was initially seen at [facility] for atrial fibrillation with rapid ventricular response, was  found to have a non-ST segment elevated MI and was transferred from  [facility] to [facility].  In the interim the patient had been having trouble  with a colonic ileus, which resolved.  Subsequently thereafter, the  patient began having back pain.  An MRI was done that showed lumbar  epidural infection without abscess and she has since been followed by  infectious diseases and neurosurgery.  When she was initially admitted,  she also had some nonhealing decubiti on the feet.  She subsequently  grew group strep bacteremia, felt to be secondary to the infected foot  ulcers.  During the acute care hospitalization the patient was noted to  have a stage II decubitus with about a 60% fibrin base.  This is  documented on one of the discharge summaries prior to the patient coming  to rehab.  Prior to coming to rehab, pulse lavage treatment was started  on July 14, 2006, as well as Tax inspector treatment.  Wound/ostomy  care RNs were following.  Per their note on August 11, 2006, wound had not  been improving so Santyl changed to  Accuzyme, pulse lavage was  continued, and p.r.n. sharp debridement was recommended to PT, who was  performing the pulse lavage.  By August 26, 2006, the patient was found  have a mild elevation in white count to 14,000 and she has had begun  having new odor from the wound.  The wound/ostomy care nurse saw the  patient on April 17 and documented the same findings as well as some  greenish drainage, and the wound appeared to be larger and now bone was  exposed.  Surgical evaluation has been recommended.   REVIEW OF SYSTEMS:  As above.  The patient verbalizes concerns over any  surgical procedure given her very debilitated state during her initial  acute care hospitalization.  In her words she states, they almost lost  me.   SOCIAL HISTORY:  No alcohol.  No tobacco.  She is married.   PAST MEDICAL HISTORY:  1. Diabetes.  2. Diabetic foot ulcers with associated neuropathy.  3. Hypertension.  4. Gout.  5. New-onset atrial fibrillation February 2008, maintaining  sinus      rhythm prior to transfer to rehab.  6. Non-ST segment elevated MI, February 2008.   PAST SURGICAL HISTORY:  1. Hysterectomy.  2. Appendectomy.  3. Cholecystectomy.  4. Prior umbilical hernia repair.   ALLERGIES:  ACE INHIBITORS, which cause cough.   CURRENT MEDICATIONS:  Lopressor, K-Dur, Avapro, multiple vitamin,  aspirin, Lovenox daily, sliding scale insulin, Protonix, Lantus insulin,  Tylenol, Glucophage, Benaprotein powder, Lexapro, Diflucan, Lactinex,  Fibercon, Dulcolax suppository, glyburide, Rocephin.   PHYSICAL EXAMINATION:  GENERAL:  Pleasant female patient up in the  wheelchair, complaining of soreness at the sacral area.  VITAL SIGNS:  Temperature 100.1, BP 125/63, pulse 88 and regular,  respirations 20.  NEUROLOGIC:  Alert and oriented x3.  She is moving all extremities x4.  She has apparent lower extremity weakness and decreased mobility,  difficulty attempting to stand alone but was able to lean  forward in the  chair and enough truncal strength to not fall forward.  HEENT:  Head normocephalic.  Sclerae are not noninjected.  NECK:  Supple.  No adenopathy.  CHEST:  Bilateral lung sounds are clear to auscultation.  Respiratory  effort is nonlabored.  She is on room air.  CARDIAC:  There is a grade 2-3/6 systolic murmur best heard at left  sternal border second intercostal space.  No JVD.  Pulses regular.  ABDOMEN:  Soft, nontender, nondistended, obese.  SKIN:  In the sacrum there is a deep wound directly over the sacral area  with palpable bone, positive odor, positive greenish drainage on the  wound on the dressings.  The base of the wound has pale hypertrophied  tissue, somewhat boggy to touch.  The sidewalls are also pale.  There  are also several areas of necrotic fibrin that deposited.  There is a  dark red tone but is not warm to the touch extending around the  perimeter of the skin wound about 2-3 cm from the borders, and her base  of her wound is quite tender.   LABORATORY DATA:  White count today is 17,300 with a neutrophil count of  80%.  White count 14,900, hemoglobin 8.4, platelets 338,000.  ESR is  greater than 140.  Her ESR is been persistently elevated since  admission.  Sodium 134, potassium 4.1, CO2 27, glucose 76, BUN 9,  creatinine 0.30.  Prealbumin on February 25 was 9.7.   DIAGNOSTICS:  She has not had any x-rays or EKGs performed since  transferring from the acute care facility to rehab.   IMPRESSION:  1. Nonhealing stage IV sacral decubitus with probable associated      osteomyelitis based on clinical exam.  2. Leukocytosis, increasing.  3. Known lumbar epidural infection with discitis, followed by      infectious disease.  4. Non ST-segment elevated myocardial infarction February 2008.      Initial presentation with atrial fibrillation, maintaining sinus      rhythm prior to transfer to rehab. 5. Systolic murmur.  Echo done June 26, 2006, shows  mild to      moderate aortic stenosis without any hemodynamic significance and      mild mitral regurgitation.  6. Severe protein-calorie malnutrition.   PLAN:  1. Probable OR for debridement in the morning.  This is pending Dr.      Annette Stable discussion with the patient and her husband.  2. Go ahead and place the patient on n.p.o. status, hold her Lovenox      and check a prealbumin  level.  3. With increasing white count and changes in the wound, consider      broadening antibiotic coverage from      Rocephin.  Infectious disease is following the patient and they      have been the patient today.  As of present, they plan on a total      of 96 days of ceftriaxone followed by p.o. amoxicillin to treat the      discitis, but this is subject to change pending their evaluation      based on knowledge of the possibly infected sacral decubitus.      Allison L. Rennis Harding, N.P.    ______________________________  Anselm Pancoast. Zachery Dakins, M.D.    ALE/MEDQ  D:  08/27/2006  T:  08/28/2006  Job:  [id number]   cc:   Dr. Jarrett Soho R. Jacinto Halim, MD

## 2010-09-27 NOTE — H&P (Signed)
Kelsey Williams, Kelsey Williams                ACCOUNT NO.:  [id number]   MEDICAL RECORD NO.:  [id number]          PATIENT TYPE:  AMB   LOCATION:                                FACILITY:  [facility]   PHYSICIAN:  Dalia Heading, M.D.  DATE OF BIRTH:  08-Dec-1945   DATE OF ADMISSION:  DATE OF DISCHARGE:  LH                                HISTORY & PHYSICAL   CHIEF COMPLAINT:  Right-sided abdominal pain of unknown etiology.   HISTORY OF PRESENT ILLNESS:  The patient is a 65 year old white female who  was referred for evaluation and treatment of worsening right-sided abdominal  pain. It has been present for some time, but has recently worsened over the  past few months.  She has had a cholecystectomy in the remote past.  She had  a colonoscopy done many years ago.  No fever, chills, jaundice have been  noted.  Pain is crampy in nature. It is not made worse with eating.  No  hematochezia, diarrhea, constipation have been noted.   PAST MEDICAL HISTORY:  Includes non-insulin dependent diabetes mellitus.   PAST SURGICAL HISTORY:  Umbilical herniorrhaphy, cholecystectomy in 2001,  partial hysterectomy.   CURRENT MEDICATIONS:  Metformin, lisinopril, Naprosyn.   ALLERGIES:  No known drug allergies.   REVIEW OF SYSTEMS:  Noncontributory.   PHYSICAL EXAMINATION:  GENERAL: The patient is a well-developed, well-  nourished white female in no acute distress.  HEENT:  Examination reveals no scleral icterus.  LUNGS:  Clear to auscultation with equal breath sounds bilaterally.  HEART:  Examination reveals regular rate and rhythm without history, for,  murmurs.  ABDOMEN: The abdomen is soft and nondistended.  She is tender along the  right lower side of her abdomen to palpation.  No hepatosplenomegaly or  masses noted.  Small umbilical hernia is present.   CT scan of the abdomen was unremarkable except for the umbilical hernia.  Lab tests were for the most part unremarkable except for a glucose of 307,  alkaline phosphatase slightly elevated at126.  Hematocrit was 47.   IMPRESSION:  Abdominal pain of unknown etiology.   PLAN:  The patient is scheduled for colonoscopy on 10/07/2005.  Risks and  benefits of the procedure including bleeding and perforation were fully  explained to the patient, gave informed consent.      Dalia Heading, M.D.  Electronically Signed     MAJ/MEDQ  D:  09/23/2005  T:  09/23/2005  Job:  [id number]   cc:   Kirk Ruths, M.D.  Fax: [phone]

## 2010-09-27 NOTE — Consult Note (Signed)
[name], Kelsey Williams                ACCOUNT NO.:  [id number]   MEDICAL RECORD NO.:  [id number]          PATIENT TYPE:  INP   LOCATION:  2112                         FACILITY:  [facility]   PHYSICIAN:  Hewitt Shorts, M.D.DATE OF BIRTH:  1946-01-22   DATE OF CONSULTATION:  06/27/2006  DATE OF DISCHARGE:                                 CONSULTATION   HISTORY OF PRESENT ILLNESS:  The patient is a 65 year old right-handed  white female with numerous significant medical difficulties who has been  found to have an epidural infectious process and neurosurgical  consultation was requested by Dr. Brien Few from the [facility] hospitalist  service.   Patient's difficulties began about 8 or 9 days ago.  She described  tremulousness.  She had some fever, had some mild nausea.  It is unclear  whether that tremulousness that she describes might have been a rigor.  Patient went to see her primary physician, Dr. Phillips Odor, and was  prescribed Cipro for presumed urinary tract infection.  The patient says  that urinalysis and urine culture were not performed and the chart does  not reflect any record of urine studies having been done.   In any event, the patient continued to be ill and after a few more days  continued to have what she describes as a tremulousness.  She had a fall  in her home and earlier this week was taken to [facility] Emergency Room.  She currently was evaluated and found to have  atrial fibrillation with a rapid ventricular response.  The patient was  transferred to the service of Dr. Jacinto Halim with [facility] and  Vascular.  Her atrial fibrillation has been treated.  She was found to  have, according to the chart, suffered a subendocardial myocardial  infarction.   At the time of admission, the chart indicates that her blood sugar was  greater than 500 and her white blood cell count was 26.4 and a question  of whether sepsis was present was raised and therefore  cultures were  performed.  Urine cultures show no growth, but blood cultures show two  bottles growing out group B streptococcus (gram-positive cocci in  chains).   It should be noted that the patient has a history of diabetes mellitus  since 1996.  She apparently has been on oral treatment.  Also it should  be noted that during this hospitalization she has been found to have  abdominal distention.  She has undergone gastroenterology consultation  and it was felt that she has a colonic ileus.   Symptomatically the patient denies weakness in her lower extremities;  however, she does note numbness and tingling in the distal lower  extremities from her knees distally that has been present for at least  several months.   Patient has also been complaining of back pain and further describes  this across the low back that does extend down to the buttocks and  thighs over the past week or so.   Patient's ciprofloxacin was continued at the time of admission.  Once  the blood cultures showed gram-positive  cocci, she was started on  vancomycin.  Subsequently Rocephin was added.  She also was started on  Diflucan and subsequently the Cipro and the vancomycin and the Diflucan  have all been discontinued.  She has been continued on Rocephin.  All  this treatment has been done by the staff of [facility] and  Vascular, the [facility] hospitalist service, and the infectious disease  consultation service.   PAST MEDICAL HISTORY:  Notable for a history of diabetes mellitus as  well as for the other difficulties that developed over this past week  which included atrial fibrillation as well as a subendocardial  myocardial infarction.  She does not describe a history of cancer,  stroke, peptic ulcer disease or lung disease.   PAST SURGICAL HISTORY:  Previous surgery includes appendectomy,  cholecystectomy and hysterectomy.   ALLERGIES:  SHE DENIES ALLERGIES TO MEDICATIONS.   MEDICATIONS AT  THE TIME OF ADMISSION:  Apparently include:  1. Lisinopril 25 mg daily.  2. Glyburide/metformin 5/500 b.i.d.  3. Ciprofloxacin.   FAMILY HISTORY:  Her parents have passed on.  Her mother died at age 56.  She had atherosclerotic coronary vascular disease.  Father died at age  78.  He had glaucoma and a myocardial infarction.   SOCIAL HISTORY:  The patient is married.  She has not been working for  several years.  She has not smoked tobacco nor does she drink alcoholic  beverages.   REVIEW OF SYSTEMS:  Notable for the symptoms described in the history of  present illness and past medical history but is otherwise unremarkable.   PHYSICAL EXAMINATION:  GENERAL:  Patient is an ill-appearing female,  drowsy but easily aroused.  VITAL SIGNS:  Temperature is 98.6, pulse 102, blood pressure 155/65.  EXTREMITIES:  Examination shows mild edema in the distal lower  extremities.  There is evidence of ulceration on the feet, on the distal  dorsum of the right foot and on the undersurface of the distal left  foot.  No obvious purulence is seen.  NEUROLOGIC EXAMINATION:  Shows on mental status, the patient is drowsy  but easily aroused.  Oriented to her name, [facility], February  2008.  She follows commands.  Her speech is fluent and she has good  comprehension.  Cranial nerves show pupils are equal, round and reactive  to light, about 2.5 mm bilaterally.  Extraocular movements are intact.  Facial movement is symmetrical.  Hearing is present bilaterally.  Palate  movement is symmetrical.  Shoulder shrug is symmetrical and tongue is  midline.  Motor examination shows 5/5 strength in the upper extremities  including the deltoid, biceps, triceps, intrinsics and grip.  There is  no drift to the upper extremities.  In the lower extremities the  strength is 5/5 in the left iliopsoas as well as in the quadriceps, dorsiflexors, extensor hallucis longus and plantar flexor bilaterally.  In testing  of the right iliopsoas, she gives way with complaints of  pain, but the strength is at least 4- to 4/5.  Sensation is decreased to  pinprick in her legs and feet bilaterally.  Reflexes are minimal in the  biceps, brachialis, triceps, quadriceps symmetrical bilaterally.  Toes  are downgoing bilaterally.  Gait and stance were not tested due to her  medical condition.   STUDIES:  MRI of the lumbar spine and thoracic spine was reviewed with  Dr. Conard Novak and Dr. Elie Goody both from the radiology service.  We  also reviewed her  CT scan of the abdomen and pelvis.   The MRIs of the thoracic and lumbar spine show evidence of degenerative  disk disease at the L4-5 and L5-S1 levels.  Drs. Hill and Clark do not  feel that there is diskitis or osteomyelitis present.  They feel that  there does not appear to be any changes in the endplates of the  vertebral bodies and they feel the changes at those levels are  degenerative in nature.   The more significant finding is of changes within the epidural space  through the lumbar region and through the lower thoracic region with  some changes even extending more rostrally.  There is evidence of  epidural fullness dorsally in the T10-11 region essentially into the  left and in the lumbar region particularly at the L2-3 level dorsally in  the midline.  In both areas there is evidence of mild mass effect but  not significant thecal sac or spinal cord compression.  The CT scan of  the abdomen and pelvis does not reveal any evidence of infection within  the disk spaces of the thoracolumbar spine nor evidence of infection  within the psoas muscles.   IMPRESSION:  Ill and debilitated female with multiple significant  medical difficulties with recent onset atrial fibrillation and a  subendocardial myocardial infarction earlier this week with sepsis  including evidence of an epidural infectious process.  It is not felt  that this process has formed a distinct  abscess but rather there is  inflammation and swelling of infected epidural tissue in the lower  thoracic and lumbar regions.   Neurologically patient may have some mild proximal right lower extremity  weakness and she may be giving way due to discomfort and pain.  The  remainder of the examination, though, shows good strength through the  upper and lower extremities and there is no evidence of pathologic  reflexes.   RECOMMENDATIONS:  I discussed the case with Drs. Margo Aye and Clark in  radiology.  I discussed the case with Dr. Enedina Finner from the  infectious disease service as well as with Dr. Brien Few from the [facility]  hospitalist service.  I have also had an opportunity to discuss the  patient case with my partner, Dr. Colon Branch and have thoroughly  discussed the situation with the patient.   In reviewing the overall condition of this patient, I do not favor surgical intervention at this time.  Surgery, if it were considered,  would be quite extensive requiring a long thoracolumbar laminectomy for  debridement of the epidural space and in balancing the potential benefit  of such an extensive surgical intervention versus the significant risks  due to significant comorbidities, it is felt by all involved that it is  best to continue to treat this with antibiotic therapy and monitor her  condition and progress.   In speaking with Dr. Ninetta Lights, he recommended that we increase the  Rocephin dose, which I have ordered.  He asked for another gram of  Rocephin to be given now and as of tomorrow morning to be increased to 2  g q. a.m.  I have requested neural checks tomorrow in terms of the  function in her lower extremities as well as that no anticoagulation or  antiplatelet agents be given because of the potential need for surgery  if progressive neurologic dysfunction develops and have requested C-  reactive protein, sed rate as well as CBC with differential and BMET for  the  morning.  We will continue to follow this patient along with the other treating  services.      Hewitt Shorts, M.D.  Electronically Signed     RWN/MEDQ  D:  06/27/2006  T:  06/28/2006  Job:  [id number]

## 2010-09-27 NOTE — H&P (Signed)
Kelsey Williams, Kelsey Williams                ACCOUNT NO.:  [id number]   MEDICAL RECORD NO.:  [id number]          PATIENT TYPE:  IPS   LOCATION:  4003                         FACILITY:  [facility]   PHYSICIAN:  Ellwood Dense, M.D.   DATE OF BIRTH:  1946-01-24   DATE OF ADMISSION:  07/17/2006  DATE OF DISCHARGE:                              HISTORY & PHYSICAL   PRIMARY CARE PHYSICIAN:  Dr. Kandyce Rud.   NEUROSURGEON:  Hewitt Shorts, M.D.   INFECTIOUS DISEASE:  Cliffton Asters, M.D.   ORTHOPEDIST:  Nadara Mustard, MD.   GASTROENTEROLOGIST:  Hedwig Morton. Juanda Chance, MD.   CARDIOLOGIST:  Cristy Hilts. Jacinto Halim, MD.   HOSPITALISTS:  [facility].   PALLIATIVE CARE:  Melissa L. Ladona Ridgel, MD.   HISTORY OF PRESENT ILLNESS:  Ms. Cerveny is a 64 year old adult female  with history of type 2 diabetes mellitus, along with recent urinary  tract infection.  The patient was admitted to [facility]  June 24, 2006, with low back muscle spasms along with atrial  fibrillation with rapid ventricular response.  Blood sugar on admission  was 449.  She was evaluated by Dr. Jacinto Halim with elevated cardiac enzymes.  She was started on IV heparin for non-ST wave MI.  She developed  leukocytosis and was noted to have chronic foot ulcers with subsequent  blood culture showing group B Strep.  She also had issues regarding  colonic ileus and she was made n.p.o. and treated with IV Reglan.  Hospice has been following for hyperglycemia and other medical issues.   Dr. Orvan Falconer from infectious disease was consulted regarding input after  an MRI scan showed abnormal enhancement at L2-3 with bilateral pulmonary  processes.  She was initially placed on IV Rocephin.  Dr. Lajoyce Corners was  consulted for foot ulcers and the patient underwent I&D by Dr. Lajoyce Corners at  bedside July 01, 2006.  Those were treated with Bactroban dressing  changes.  Dr. Newell Coral from neurosurgery was consulted regarding the  enhancement at L2-3 and he  recommended continued antibiotics for greater  than 6 months.  [facility] was consulted for ileus and she was shown to  have slow improvement and was tolerating low residual diet.  She did  develop sacral decubiti measuring 11 x 6 cm with no improvement with  Bentyl.  Pulse lavage was initiated July 14, 2006.  She has had  recurrent fever secondary to vancomycin-resistant enterococcus and her  antibiotics were changed to Zyvox for five days and then changed back to  Rocephin.  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  The  plan is for long-term antibiotics of unknown duration.  The patient  continues with paraparesis along with pain management per palliative  care.  She has had mood noted to be labile and complained of recent  headaches.  Infectious disease has recommended Zyvox through July 19, 2006, and then that was changed to Rocephin through August 12, 2006.  Cardiology is recommending resumption of Coumadin if she develops  recurrent atrial fibrillation.  The patient was evaluated by the rehabilitation physicians and felt to  be an appropriate candidate for inpatient rehabilitation.   ALLERGIES:  ACE INHIBITORS cause cough.   MEDICATIONS PRIOR TO ADMISSION:  1. Skelaxin 800 mg t.i.d.  2. Cipro 500 mg b.i.d.  3. Oxycodone p.r.n.  4. Glyburide/metformin 5/500 daily.   REVIEW OF SYSTEMS:  Positive for weakness, insomnia, numbness,  constipation, right lower extremity spasticity.   PAST MEDICAL HISTORY:  1. Type 2 diabetes mellitus.  2. Diabetic foot ulcers.  3. Hypertension.  4. Gout.  5. Hysterectomy.  6. Appendectomy.  7. Cholecystectomy.  8. Umbilical hernia repair.  9. History of recent falls x4.  10.Neuropathy.   FAMILY HISTORY:  Positive for diabetes mellitus and coronary artery  disease.   SOCIAL HISTORY:  Patient is married and lives in one level home with 4-5  steps to  enter.  She does not use alcohol or tobacco.  Her husband works  first shift from 7 a.m. to 5:30 p.m.   FUNCTIONAL STATUS PRIOR TO ADMISSION:  Independent.   LABORATORY DATA:  Recent hemoglobin was 9.8 with hematocrit 29 and  platelet count of 429,000 with white count of 8.6.  Recent sodium was  135, potassium 3.6, chloride 100, CO2 26, BUN 7, creatinine 0.4, glucose  145.  Alkaline phosphatase 128 with SGOT 17, SGPT 23, albumin 1.8.  Blood cultures x3 June 25, 2006, were negative.  Hemoglobin A1c was  13.5 measured June 25, 2006.  TSH July 11, 2006, was 0.94.  Erythrocyte sedimentation rate measured July 13, 2006, was elevated at  139.   KUB July 14, 2006, showed colonic distention resolved with mild gastric  distention and stool throughout the colon without change.   PHYSICAL EXAMINATION:  GENERAL APPEARANCE:  A large well-appearing adult  female seated in a chair in mild acute discomfort.  VITAL SIGNS:  Blood pressure 156/76 with pulse 72, respiratory rate 22  and temperature 98.9.  HEENT:  Normocephalic and atraumatic.  LUNGS:  Clear to auscultation bilaterally.  CARDIOVASCULAR:  Regular rate and rhythm, S1 and S2 without murmurs.  ABDOMEN:  Soft, obese, nontender with positive bowel sounds.  EXTREMITIES:  Bilateral upper extremity exam showed 4+/5 strength  throughout.  Bulk and tone were normal.  Reflexes were 2+ and  symmetrical.  Lower extremity exam showed hip flexion, knee extension,  ankle dorsiflexion at 3 to 3+/5.  Bulk and tone normal and reflexes were  2+ and symmetrical.  NEUROLOGIC:  Alert and oriented x3.  Cranial nerves II-XII grossly  intact.   IMPRESSION:  1. L2-5 diskitis/osteomyelitis with Strep bacteremia.  2. Type 2 diabetes mellitus.  3. Paroxysmal atrial fibrillation with heart rate in normal sinus      rhythm at present.  4. Bilateral foot ulcers, well-healed and treated with dressing      changes. 5. Sacral decubitus with necrotic base  treated with dressing changes.   Presently the patient has deficits in activities of daily living,  transfers and ambulation secondary to the L2-5 diskitis and  osteomyelitis.   PLAN:  1. Admit to rehabilitation unit for daily physical therapy for range      of motion, strengthening, bed mobility, transfers, pregait      training, gait training and equipment evaluation.  2. Occupational therapy for range of motion, strengthening, ADLs,      cognitive/perceptual training, splinting and equipment evaluation.  3. Rehab nursing for skin care, wound care and bowel and bladder      training.  4. Case management to assess home environment, assist with discharge      planning and arrange for appropriate follow-up care.  5. Social worker to assess family and social support, consultation      regarding disability issues and assist with discharge planning.  6. Low residual carbohydrate modified medium diabetic diet.  7. MS Contin 30 mg q.a.m. and 15 mg q.p.m.  8. Dilaudid 0.5 mg IV 30 minutes prior to pulse lavage.  9. Check erythrocyte sedimentation rate along with CBC and CMET      Monday, July 20, 2006.  10.MRI of cervical, thoracic and lumbar spine with and without      contrast on August 03, 2006.  11.Contact precautions.  12.Air mattress overlay.  13.Hydrotherapy Monday-Saturday and apply Santyl wet-to-dry dressing      daily.  14.MRI scan of the brain with and without contrast to evaluate      headaches, perform now.  15.Ecotrin 325 mg p.o. daily.  16.Subcu Lovenox 40 mg daily for DVT prophylaxis.  17.Protonix 40 mg daily for GI prophylaxis.  18.Monitor hypertension on Avapro 150 mg p.o. daily along with      Lopressor 100 mg p.o. b.i.d.  19.Lantus insulin 32 units subcu q.12h.  20.Rocephin IV 1 g q.24h. July 19, 2006, through August 12, 2006.  21.Zyvox 60 mg IV q.12h. through July 19, 2006.  22.Ultram 50 mg p.o. q.i.d. p.r.n.  23.Contact precautions for VRE in ER.  24.Wound  care consult for sacral decubitus.  25.Dulcolax suppository one per rectum at 7 p.m. x1 week, then change      to p.r.n. if no bowel movement in two days.  26.Soap suds enema today after admission and start Dulcolax      suppository tomorrow.  27.Wash left foot daily with antibacterial soap and apply Bactroban      ointment along with dry dressing daily.  28.Beneprotein supplement one scoop p.o. t.i.d.  29.Ecotrin 325 mg one p.o. daily.   PROGNOSIS:  Fair.   ESTIMATED LENGTH OF STAY:  Three to four weeks.   GOALS:  Modified independent, ADLs, transfers and ambulation along with  medical stability.           ______________________________  Ellwood Dense, M.D.     DC/MEDQ  D:  07/17/2006  T:  07/17/2006  Job:  [id number]

## 2010-09-27 NOTE — Consult Note (Signed)
NAMEBRITANY, Kelsey Williams                ACCOUNT NO.:  [id number]   MEDICAL RECORD NO.:  [id number]          PATIENT TYPE:  IPS   LOCATION:  4003                         FACILITY:  [facility]   PHYSICIAN:  Revonda Standard L. Rennis Harding, N.P. DATE OF BIRTH:  1945/10/04   DATE OF CONSULTATION:  DATE OF DISCHARGE:                                 CONSULTATION   ADDENDUM:   PHYSICAL EXAMINATION:  In the physical exam portion of the consultation,  I described the wound but neglected to mention the size parameters.  This is a wound that estimated measures 8 x 10 cm with undermining of  the wound borders.      Allison L. Rennis Harding, N.P.     ALE/MEDQ  D:  08/28/2006  T:  08/28/2006  Job:  [id number]

## 2011-01-21 ENCOUNTER — Other Ambulatory Visit: Payer: Self-pay | Admitting: Family Medicine

## 2011-01-21 DIAGNOSIS — Z1231 Encounter for screening mammogram for malignant neoplasm of breast: Secondary | ICD-10-CM

## 2011-02-25 LAB — CREATININE, SERUM
Creatinine, Ser: 0.42
GFR calc non Af Amer: >60

## 2011-02-28 ENCOUNTER — Ambulatory Visit
Admission: RE | Admit: 2011-02-28 | Discharge: 2011-02-28 | Disposition: A | Discharge status: Home / Self Care | Payer: No Typology Code available for payment source | Source: Ambulatory Visit | Attending: Family Medicine | Admitting: Family Medicine

## 2011-02-28 DIAGNOSIS — Z1231 Encounter for screening mammogram for malignant neoplasm of breast: Secondary | ICD-10-CM

## 2011-09-16 ENCOUNTER — Encounter (HOSPITAL_BASED_OUTPATIENT_CLINIC_OR_DEPARTMENT_OTHER): Attending: General Surgery

## 2011-09-16 DIAGNOSIS — L8990 Pressure ulcer of unspecified site, unspecified stage: Secondary | ICD-10-CM | POA: Insufficient documentation

## 2011-09-16 DIAGNOSIS — L89109 Pressure ulcer of unspecified part of back, unspecified stage: Secondary | ICD-10-CM | POA: Insufficient documentation

## 2011-09-16 DIAGNOSIS — Z79899 Other long term (current) drug therapy: Secondary | ICD-10-CM | POA: Insufficient documentation

## 2011-09-16 DIAGNOSIS — I10 Essential (primary) hypertension: Secondary | ICD-10-CM | POA: Insufficient documentation

## 2011-09-16 DIAGNOSIS — E119 Type 2 diabetes mellitus without complications: Secondary | ICD-10-CM | POA: Insufficient documentation

## 2011-09-16 DIAGNOSIS — Z794 Long term (current) use of insulin: Secondary | ICD-10-CM | POA: Insufficient documentation

## 2011-09-16 NOTE — H&P (Signed)
Kelsey Williams, Kelsey Williams                ACCOUNT NO.:  [id number]  MEDICAL RECORD NO.:  [id number]  LOCATION:  FOOT                         FACILITY:  [facility]  PHYSICIAN:  Joanne Gavel, M.D.        DATE OF BIRTH:  1946/02/26  DATE OF ADMISSION:  09/16/2011 DATE OF DISCHARGE:                             HISTORY & PHYSICAL   CHIEF COMPLAINT:  Wound of presacral area.  HISTORY OF PRESENT ILLNESS:  This is a 66 year old female, somewhat limited in mobility, had a comfortable cushion in her wheelchair which was replaced approximately 4 months ago, and that has resulted in a presacral wound.  It has been treated with diathermy treatments and with electrical stimulation.  She does not know what medications have been applied.  Her last laboratory work was done in January.  Her past medical history, she has hypertension, type 2 diabetes.  PAST SURGICAL HISTORY:  She has had hysterectomy, neurogenic bladder for which she has had a suprapubic cystostomy, cholecystectomy, hemorrhoidectomy, and a lower back operation that eventuated in 6 month rehab including use of the VAC.  ALLERGIES:  ACE INHIBITORS.  MEDICATIONS:  Levemir, NovoLog, citalopram, losartan, Xanax, MiraLAX, Claritin, Protonix, aspirin, multivitamins, Tylenol, metformin, metoprolol, and Lasix.  Cigarettes and alcohol none.  REVIEW OF SYSTEMS:  As above.  PHYSICAL EXAMINATION:  VITAL SIGNS:  Temperature 99.1, pulse 63, respirations 18, blood pressure 134/70. GENERAL:  Glucose is 145, a well developed, well nourished, awake and alert. CHEST:  Clear. HEART:  Regular rhythm. EXTREMITIES:  Normal symmetrical strength of upper extremities.  Lower extremities, not examined. ABDOMEN:  Not examined. PELVIS:  In the presacral area, there is a relatively superficial 3.9 x 2.9 transverse wound.  This is right distal to an area that appears to have been healed by secondary intention.  This looks like a decubitus, but she says this is  secondary to an infection.  IMPRESSION:  Decubitus ulcer.  PLAN OF TREATMENT:  She says that she has had the proper cushioning in bed and wheelchair.  We will start treating with silver collagen and I will see her in 7 days.     Joanne Gavel, M.D.     RA/MEDQ  D:  09/16/2011  T:  09/16/2011  Job:  [id number]  cc:   Jethro Bastos, M.D.

## 2011-10-14 ENCOUNTER — Encounter (HOSPITAL_BASED_OUTPATIENT_CLINIC_OR_DEPARTMENT_OTHER): Payer: Medicare (Managed Care) | Attending: General Surgery

## 2011-10-14 DIAGNOSIS — I10 Essential (primary) hypertension: Secondary | ICD-10-CM | POA: Insufficient documentation

## 2011-10-14 DIAGNOSIS — Z794 Long term (current) use of insulin: Secondary | ICD-10-CM | POA: Insufficient documentation

## 2011-10-14 DIAGNOSIS — Z79899 Other long term (current) drug therapy: Secondary | ICD-10-CM | POA: Insufficient documentation

## 2011-10-14 DIAGNOSIS — L89109 Pressure ulcer of unspecified part of back, unspecified stage: Secondary | ICD-10-CM | POA: Insufficient documentation

## 2011-10-14 DIAGNOSIS — E119 Type 2 diabetes mellitus without complications: Secondary | ICD-10-CM | POA: Insufficient documentation

## 2011-10-14 DIAGNOSIS — L8990 Pressure ulcer of unspecified site, unspecified stage: Secondary | ICD-10-CM | POA: Insufficient documentation

## 2011-11-11 ENCOUNTER — Encounter (HOSPITAL_BASED_OUTPATIENT_CLINIC_OR_DEPARTMENT_OTHER): Payer: Medicare (Managed Care) | Attending: General Surgery

## 2011-11-11 DIAGNOSIS — L89109 Pressure ulcer of unspecified part of back, unspecified stage: Secondary | ICD-10-CM | POA: Insufficient documentation

## 2011-11-11 DIAGNOSIS — L8992 Pressure ulcer of unspecified site, stage 2: Secondary | ICD-10-CM | POA: Insufficient documentation

## 2011-12-16 ENCOUNTER — Encounter (HOSPITAL_BASED_OUTPATIENT_CLINIC_OR_DEPARTMENT_OTHER): Payer: Medicare (Managed Care) | Attending: General Surgery

## 2011-12-16 DIAGNOSIS — L89109 Pressure ulcer of unspecified part of back, unspecified stage: Secondary | ICD-10-CM | POA: Insufficient documentation

## 2011-12-16 DIAGNOSIS — L8992 Pressure ulcer of unspecified site, stage 2: Secondary | ICD-10-CM | POA: Insufficient documentation

## 2012-01-27 ENCOUNTER — Other Ambulatory Visit: Payer: Self-pay | Admitting: Family Medicine

## 2012-01-27 DIAGNOSIS — Z1231 Encounter for screening mammogram for malignant neoplasm of breast: Secondary | ICD-10-CM

## 2012-03-02 ENCOUNTER — Ambulatory Visit
Admission: RE | Admit: 2012-03-02 | Discharge: 2012-03-02 | Disposition: A | Discharge status: Home / Self Care | Payer: PRIVATE HEALTH INSURANCE | Source: Ambulatory Visit | Attending: Family Medicine | Admitting: Family Medicine

## 2012-03-02 DIAGNOSIS — Z1231 Encounter for screening mammogram for malignant neoplasm of breast: Secondary | ICD-10-CM

## 2012-03-05 ENCOUNTER — Other Ambulatory Visit: Payer: Self-pay | Admitting: Family Medicine

## 2012-03-05 DIAGNOSIS — R928 Other abnormal and inconclusive findings on diagnostic imaging of breast: Secondary | ICD-10-CM

## 2012-03-12 ENCOUNTER — Other Ambulatory Visit: Payer: PRIVATE HEALTH INSURANCE

## 2012-04-13 ENCOUNTER — Observation Stay (HOSPITAL_COMMUNITY): Payer: Medicare (Managed Care)

## 2012-04-13 ENCOUNTER — Encounter (HOSPITAL_COMMUNITY): Payer: Self-pay | Admitting: Emergency Medicine

## 2012-04-13 ENCOUNTER — Inpatient Hospital Stay (HOSPITAL_COMMUNITY)
Admission: EM | Admit: 2012-04-13 | Discharge: 2012-04-15 | DRG: 395 | Disposition: A | Discharge status: Home / Self Care | Payer: Medicare (Managed Care) | Attending: Internal Medicine | Admitting: Internal Medicine

## 2012-04-13 DIAGNOSIS — I252 Old myocardial infarction: Secondary | ICD-10-CM

## 2012-04-13 DIAGNOSIS — I739 Peripheral vascular disease, unspecified: Secondary | ICD-10-CM | POA: Insufficient documentation

## 2012-04-13 DIAGNOSIS — E669 Obesity, unspecified: Secondary | ICD-10-CM | POA: Diagnosis present

## 2012-04-13 DIAGNOSIS — Z888 Allergy status to other drugs, medicaments and biological substances status: Secondary | ICD-10-CM

## 2012-04-13 DIAGNOSIS — L89109 Pressure ulcer of unspecified part of back, unspecified stage: Secondary | ICD-10-CM | POA: Diagnosis present

## 2012-04-13 DIAGNOSIS — E113599 Type 2 diabetes mellitus with proliferative diabetic retinopathy without macular edema, unspecified eye: Secondary | ICD-10-CM | POA: Insufficient documentation

## 2012-04-13 DIAGNOSIS — G629 Polyneuropathy, unspecified: Secondary | ICD-10-CM | POA: Diagnosis present

## 2012-04-13 DIAGNOSIS — I10 Essential (primary) hypertension: Secondary | ICD-10-CM | POA: Diagnosis present

## 2012-04-13 DIAGNOSIS — R82998 Other abnormal findings in urine: Secondary | ICD-10-CM | POA: Diagnosis present

## 2012-04-13 DIAGNOSIS — D126 Benign neoplasm of colon, unspecified: Secondary | ICD-10-CM | POA: Diagnosis present

## 2012-04-13 DIAGNOSIS — K620 Anal polyp: Secondary | ICD-10-CM | POA: Diagnosis present

## 2012-04-13 DIAGNOSIS — E119 Type 2 diabetes mellitus without complications: Secondary | ICD-10-CM | POA: Diagnosis present

## 2012-04-13 DIAGNOSIS — I4891 Unspecified atrial fibrillation: Secondary | ICD-10-CM | POA: Diagnosis present

## 2012-04-13 DIAGNOSIS — F418 Other specified anxiety disorders: Secondary | ICD-10-CM | POA: Insufficient documentation

## 2012-04-13 DIAGNOSIS — K5900 Constipation, unspecified: Secondary | ICD-10-CM | POA: Diagnosis present

## 2012-04-13 DIAGNOSIS — I509 Heart failure, unspecified: Secondary | ICD-10-CM | POA: Insufficient documentation

## 2012-04-13 DIAGNOSIS — E785 Hyperlipidemia, unspecified: Secondary | ICD-10-CM | POA: Insufficient documentation

## 2012-04-13 DIAGNOSIS — Z794 Long term (current) use of insulin: Secondary | ICD-10-CM

## 2012-04-13 DIAGNOSIS — I480 Paroxysmal atrial fibrillation: Secondary | ICD-10-CM | POA: Insufficient documentation

## 2012-04-13 DIAGNOSIS — I359 Nonrheumatic aortic valve disorder, unspecified: Secondary | ICD-10-CM | POA: Diagnosis present

## 2012-04-13 DIAGNOSIS — K644 Residual hemorrhoidal skin tags: Secondary | ICD-10-CM | POA: Diagnosis present

## 2012-04-13 DIAGNOSIS — K219 Gastro-esophageal reflux disease without esophagitis: Secondary | ICD-10-CM | POA: Insufficient documentation

## 2012-04-13 DIAGNOSIS — L8992 Pressure ulcer of unspecified site, stage 2: Secondary | ICD-10-CM | POA: Diagnosis present

## 2012-04-13 DIAGNOSIS — IMO0001 Reserved for inherently not codable concepts without codable children: Secondary | ICD-10-CM | POA: Insufficient documentation

## 2012-04-13 DIAGNOSIS — K922 Gastrointestinal hemorrhage, unspecified: Secondary | ICD-10-CM

## 2012-04-13 DIAGNOSIS — Z86010 Personal history of colonic polyps: Secondary | ICD-10-CM

## 2012-04-13 DIAGNOSIS — Z7982 Long term (current) use of aspirin: Secondary | ICD-10-CM

## 2012-04-13 DIAGNOSIS — N319 Neuromuscular dysfunction of bladder, unspecified: Secondary | ICD-10-CM | POA: Diagnosis present

## 2012-04-13 DIAGNOSIS — G609 Hereditary and idiopathic neuropathy, unspecified: Secondary | ICD-10-CM | POA: Diagnosis present

## 2012-04-13 DIAGNOSIS — F329 Major depressive disorder, single episode, unspecified: Secondary | ICD-10-CM | POA: Diagnosis present

## 2012-04-13 DIAGNOSIS — D1779 Benign lipomatous neoplasm of other sites: Secondary | ICD-10-CM | POA: Diagnosis present

## 2012-04-13 DIAGNOSIS — K621 Rectal polyp: Secondary | ICD-10-CM | POA: Diagnosis present

## 2012-04-13 DIAGNOSIS — F3289 Other specified depressive episodes: Secondary | ICD-10-CM | POA: Diagnosis present

## 2012-04-13 DIAGNOSIS — K648 Other hemorrhoids: Principal | ICD-10-CM | POA: Diagnosis present

## 2012-04-13 HISTORY — DX: Reserved for inherently not codable concepts without codable children: IMO0001

## 2012-04-13 HISTORY — DX: Unspecified hemorrhoids: K64.9

## 2012-04-13 HISTORY — DX: Essential (primary) hypertension: I10

## 2012-04-13 HISTORY — DX: Depression, unspecified: F32.A

## 2012-04-13 HISTORY — DX: Hyperlipidemia, unspecified: E78.5

## 2012-04-13 HISTORY — DX: Major depressive disorder, single episode, unspecified: F32.9

## 2012-04-13 HISTORY — DX: Old myocardial infarction: I25.2

## 2012-04-13 HISTORY — DX: Personal history of colon polyps, unspecified: Z86.0100

## 2012-04-13 HISTORY — DX: Personal history of colonic polyps: Z86.010

## 2012-04-13 HISTORY — DX: Gastro-esophageal reflux disease without esophagitis: K21.9

## 2012-04-13 HISTORY — DX: Neuromuscular dysfunction of bladder, unspecified: N31.9

## 2012-04-13 HISTORY — DX: Heart failure, unspecified: I50.9

## 2012-04-13 HISTORY — DX: Type 2 diabetes mellitus without complications: E11.9

## 2012-04-13 LAB — CBC WITH DIFFERENTIAL/PLATELET
Basophils Absolute: 0 10*3/uL (ref 0.0–0.1)
Basophils Absolute: 0 10*3/uL (ref 0.0–0.1)
Basophils Relative: 0 % (ref 0–1)
Basophils Relative: 0 % (ref 0–1)
Eosinophils Absolute: 0.2 10*3/uL (ref 0.0–0.7)
Eosinophils Relative: 2 % (ref 0–5)
HCT: 37.9 % (ref 36.0–46.0)
HCT: 36.7 % (ref 36.0–46.0)
Hemoglobin: 12.7 g/dL (ref 12.0–15.0)
Lymphocytes Relative: 25 % (ref 12–46)
Lymphocytes Relative: 28 % (ref 12–46)
MCH: 27.9 pg (ref 26.0–34.0)
MCHC: 33.5 g/dL (ref 30.0–36.0)
MCV: 84.6 fL (ref 78.0–100.0)
Monocytes Absolute: 0.6 10*3/uL (ref 0.1–1.0)
Monocytes Absolute: 0.6 10*3/uL (ref 0.1–1.0)
Monocytes Relative: 8 % (ref 3–12)
Neutro Abs: 5.2 10*3/uL (ref 1.7–7.7)
Neutro Abs: 5.3 10*3/uL (ref 1.7–7.7)
Neutrophils Relative %: 64 % (ref 43–77)
Platelets: 127 10*3/uL — ABNORMAL LOW (ref 150–400)
RDW: 14 % (ref 11.5–15.5)
RDW: 14.1 % (ref 11.5–15.5)
WBC: 8.1 10*3/uL (ref 4.0–10.5)
WBC: 8.5 10*3/uL (ref 4.0–10.5)

## 2012-04-13 LAB — BASIC METABOLIC PANEL
CO2: 27 mEq/L (ref 19–32)
Chloride: 99 mEq/L (ref 96–112)
GFR calc Af Amer: >90 mL/min (ref >90)
Potassium: 4.1 mEq/L (ref 3.5–5.1)

## 2012-04-13 LAB — PROTIME-INR
INR: 1.13 (ref 0.00–1.49)
Prothrombin Time: 14.3 seconds (ref 11.6–15.2)

## 2012-04-13 LAB — GLUCOSE, CAPILLARY: Glucose-Capillary: 179 mg/dL — ABNORMAL HIGH (ref 70–99)

## 2012-04-13 MED ORDER — ALPRAZOLAM 0.5 MG PO TABS
0.5000 mg | ORAL_TABLET | Freq: Every day | ORAL | Status: DC
  Administered 2012-04-14 – 2012-04-15 (×2): 0.5 mg via ORAL
  Filled 2012-04-13 (×2): qty 1

## 2012-04-13 MED ORDER — ACETAMINOPHEN 325 MG PO TABS: 650.0000 mg | ORAL_TABLET | Freq: Four times a day (QID) | ORAL | Status: DC | PRN

## 2012-04-13 MED ORDER — INSULIN DETEMIR 100 UNIT/ML ~~LOC~~ SOLN
40.0000 [IU] | Freq: Every morning | SUBCUTANEOUS | Status: DC
  Administered 2012-04-14 – 2012-04-15 (×2): 40 [IU] via SUBCUTANEOUS
  Filled 2012-04-13: qty 10

## 2012-04-13 MED ORDER — SODIUM CHLORIDE 0.9 % IJ SOLN
3.0000 mL | Freq: Two times a day (BID) | INTRAMUSCULAR | Status: DC
  Administered 2012-04-15: 3 mL via INTRAVENOUS

## 2012-04-13 MED ORDER — LATANOPROST 0.005 % OP SOLN
1.0000 [drp] | Freq: Every day | OPHTHALMIC | Status: DC
  Administered 2012-04-13 – 2012-04-14 (×2): 1 [drp] via OPHTHALMIC
  Filled 2012-04-13: qty 2.5

## 2012-04-13 MED ORDER — SIMVASTATIN 40 MG PO TABS
40.0000 mg | ORAL_TABLET | Freq: Every day | ORAL | Status: DC
  Administered 2012-04-14: 40 mg via ORAL
  Filled 2012-04-13 (×2): qty 1

## 2012-04-13 MED ORDER — INSULIN ASPART 100 UNIT/ML ~~LOC~~ SOLN
10.0000 [IU] | Freq: Three times a day (TID) | SUBCUTANEOUS | Status: DC
  Administered 2012-04-14 (×3): 10 [IU] via SUBCUTANEOUS

## 2012-04-13 MED ORDER — CITALOPRAM HYDROBROMIDE 20 MG PO TABS
20.0000 mg | ORAL_TABLET | Freq: Every day | ORAL | Status: DC
  Administered 2012-04-14 – 2012-04-15 (×2): 20 mg via ORAL
  Filled 2012-04-13 (×2): qty 1

## 2012-04-13 MED ORDER — ONDANSETRON HCL 4 MG/2ML IJ SOLN
4.0000 mg | Freq: Four times a day (QID) | INTRAMUSCULAR | Status: DC | PRN
  Administered 2012-04-15: 4 mg via INTRAVENOUS
  Filled 2012-04-13: qty 2

## 2012-04-13 MED ORDER — LORATADINE 10 MG PO TABS
10.0000 mg | ORAL_TABLET | Freq: Every day | ORAL | Status: DC
  Administered 2012-04-14 – 2012-04-15 (×2): 10 mg via ORAL
  Filled 2012-04-13 (×2): qty 1

## 2012-04-13 MED ORDER — ONDANSETRON HCL 4 MG PO TABS: 4.0000 mg | ORAL_TABLET | Freq: Four times a day (QID) | ORAL | Status: DC | PRN

## 2012-04-13 MED ORDER — SODIUM CHLORIDE 0.9 % IV SOLN: 250.0000 mL | INTRAVENOUS | Status: DC | PRN

## 2012-04-13 MED ORDER — SODIUM CHLORIDE 0.9 % IV SOLN
INTRAVENOUS | Status: AC
  Administered 2012-04-13: 23:00:00 via INTRAVENOUS

## 2012-04-13 MED ORDER — ACETAMINOPHEN 650 MG RE SUPP: 650.0000 mg | Freq: Four times a day (QID) | RECTAL | Status: DC | PRN

## 2012-04-13 MED ORDER — ZOLPIDEM TARTRATE 5 MG PO TABS
10.0000 mg | ORAL_TABLET | Freq: Every day | ORAL | Status: DC
  Administered 2012-04-13 – 2012-04-14 (×2): 10 mg via ORAL
  Filled 2012-04-13 (×2): qty 2

## 2012-04-13 MED ORDER — PANTOPRAZOLE SODIUM 40 MG IV SOLR
40.0000 mg | Freq: Two times a day (BID) | INTRAVENOUS | Status: DC
  Administered 2012-04-13: 40 mg via INTRAVENOUS
  Filled 2012-04-13 (×3): qty 40

## 2012-04-13 MED ORDER — SODIUM CHLORIDE 0.9 % IJ SOLN: 3.0000 mL | INTRAMUSCULAR | Status: DC | PRN

## 2012-04-13 NOTE — ED Provider Notes (Signed)
History     CSN: [id number]  Arrival date & time 04/13/12  1647   First MD Initiated Contact with Patient 04/13/12 1722     Chief Complaint  Patient presents with  . Rectal Bleeding   HPI: Kelsey Williams is a 66 yo CF with history of HTN, DM, CHF, neurogenic bladder s/p suprapubic catheter, and external hemorrhoids presents with bright red bleeding per rectum. She has had 6-7 episodes of painless bleeding since last night. She describes it as large volume, mixed with stool. No abdominal pain. She has had similar bleeding over the last several months but usually resolve spontaneously. Has a long history of straining with bowel movements. Also endorses history of internal hemorrhoids requiring surgical intervention several years ago. She denies any presyncopal symptoms, dizziness, chest pain, shortness of breath or vomiting. She takes 81 mg ASA daily, but no other blood thinners.   Past Medical History  Diagnosis Date  . Hypertension   . Diabetes mellitus without complication   . CHF (congestive heart failure)   . Neurogenic bladder   . GERD (gastroesophageal reflux disease)   . Hyperlipidemia   . Depression   . H/O non-ST elevation myocardial infarction (NSTEMI)   . Aortic sclerosis   . Hx of colonic polyps   . Hypertension   . Hemorrhoids    History reviewed. No pertinent past surgical history.  History reviewed. No pertinent family history.  History  Substance Use Topics  . Smoking status: Never Smoker   . Smokeless tobacco: Not on file  . Alcohol Use: No    OB History    Grav Para Term Preterm Abortions TAB SAB Ect Mult Living                 Review of Systems  Constitutional: Negative for fever, chills and fatigue.  Eyes: Negative for photophobia and visual disturbance.  Respiratory: Negative for cough and shortness of breath.   Cardiovascular: Negative for chest pain and palpitations.  Gastrointestinal: Positive for constipation and blood in stool. Negative for  nausea, vomiting and abdominal pain.  Genitourinary: Negative for dysuria, hematuria, decreased urine volume and difficulty urinating.  Musculoskeletal: Negative for myalgias, arthralgias and gait problem.  Skin: Negative for pallor and rash.  Neurological: Negative for dizziness, light-headedness and headaches.  Psychiatric/Behavioral: Negative for confusion and agitation.    Allergies  Ace inhibitors  Home Medications  No current outpatient prescriptions on file.  BP 160/78  Pulse 73  Temp 99.5 F (37.5 C) (Oral)  Resp 18  SpO2 95%  Physical Exam  Nursing note and vitals reviewed. Constitutional: She is oriented to person, place, and time. She is cooperative. No distress.       Obese, elderly appearing female. Well-groomed   HENT:  Head: Normocephalic and atraumatic.  Mouth/Throat: Mucous membranes are normal.  Eyes: Conjunctivae normal and EOM are normal. Pupils are equal, round, and reactive to light.  Neck: Trachea normal and normal range of motion. No JVD present.  Cardiovascular: Normal rate, regular rhythm, S1 normal and S2 normal.  Exam reveals no decreased pulses.   Murmur heard.  Systolic murmur is present with a grade of 2/6  Pulmonary/Chest: Effort normal and breath sounds normal. She has no decreased breath sounds.  Abdominal: Soft. Normal appearance and bowel sounds are normal. There is no tenderness.  Genitourinary: Rectal exam shows external hemorrhoid (no actively bleeding). Rectal exam shows no mass and no tenderness. Guaiac positive stool (hematochezia noted on gloved finger).  Musculoskeletal: Normal range  of motion.  Neurological: She is alert and oriented to person, place, and time.  Skin: Skin is warm and dry.    ED Course  Procedures  Labs Reviewed  CBC WITH DIFFERENTIAL - Abnormal; Notable for the following:    Platelets 123 (*)     All other components within normal limits  BASIC METABOLIC PANEL - Abnormal; Notable for the following:     Glucose, Bld 140 (*)     All other components within normal limits  CBC WITH DIFFERENTIAL - Abnormal; Notable for the following:    Platelets 127 (*)     All other components within normal limits  PRO B NATRIURETIC PEPTIDE - Abnormal; Notable for the following:    Pro B Natriuretic peptide (BNP) 318.3 (*)     All other components within normal limits  GLUCOSE, CAPILLARY - Abnormal; Notable for the following:    Glucose-Capillary 179 (*)     All other components within normal limits  PROTIME-INR  CBC WITH DIFFERENTIAL  HEMOGLOBIN A1C  CBC WITH DIFFERENTIAL  CBC WITH DIFFERENTIAL   Dg Chest 2 View  04/14/2012  *RADIOLOGY REPORT*  Clinical Data: Admission chest radiograph; history of vascular congestion.  CHEST - 2 VIEW  Comparison: Chest radiograph performed 07/06/2009, and CTA of the chest from 07/11/2009  Findings: The lungs are well-aerated and clear.  Pulmonary vascularity is at the upper limits of normal.  There is no evidence of focal opacification, pleural effusion or pneumothorax.  The heart is borderline normal in size; the mediastinal contour is within normal limits.  No acute osseous abnormalities are seen. Clips are noted within the right upper quadrant, reflecting prior cholecystectomy.  IMPRESSION: No acute cardiopulmonary process seen.   Original Report Authenticated By: Tonia Ghent, M.D.    MDM   66 yo CF with history of HTN, DM, CHF, neurogenic bladder s/p suprapubic catheter, and external hemorrhoids presents with bright red bleeding per rectum. Afebrile, vital signs stable. No obvious source of LGIB on exam. She is HD stable and appears to be asymptomatic. Hgb 12.7, plts 123. Does take ASA but no other blood thinners, no indication for FFP. Lytes and cr stable. No abdominal imaging indicated as abdomen benign. Felt patient would benefit from inpatient admission for observation and serial Hgb measurements. Patient will likely require colonoscopy for further evaluation if  bleeding persists. Patient in agreement with plan. Admitted to [facility] in stable condition.   Reviewed labs and previous medical records, utilized in MDM  Discussed case with Dr. Bernette Mayers  Clinical Impression 1. Lower GI bleed 2. Hyperglycemia        Margie Billet, MD 04/14/12 [phone]

## 2012-04-13 NOTE — ED Notes (Signed)
Received report regarding pt.  Pt awaiting disposition at this time with family at bedside.  Pt a/o and states nothing needed at this time.

## 2012-04-13 NOTE — ED Notes (Signed)
Pt c/o BRB rectal bleeding x 6 starting last night after straining to have BM; pt denies pain; pt sts some lightheadedness when standing; pt has suprapubic cath

## 2012-04-13 NOTE — ED Notes (Signed)
Report given to rn on floor.  Pt transported via stretcher.  With monitor applied.

## 2012-04-13 NOTE — H&P (Signed)
Hospital Admission Note Date: 04/13/2012  Patient name: Kelsey Williams Medical record number: [id number] Date of birth: May 01, 1946 Age: 66 y.o. Gender: female PCP: Thane Edu, MD  Medical Service: Internal Medicine Teaching Service  Attending physician:  Dr. Rogelia Boga    1st Contact:  Dr. Zada Girt   Pager:[phone] 2nd Contact:  Dr. Manson Passey   [phone] After 5 pm or weekends: 1st Contact:      Pager: [phone] 2nd Contact:      Pager: [phone]  Chief Complaint: hematochezia  History of Present Illness:  Kelsey Williams is a 66 yo woman with PMH of DM II, hemorrhoids, NSTEMI in 2008, CHF, GERD, neurogenic bladder, history of colonic polyps, aortic stenosis, and paroxysmal A. Fib, [facility] who [facility] ED today for evaluation of painless bright red bleeding per rectum since last night with 6-7 BMs mixed with formed stools. She reports large amounts of blood, almost one pint per BM. In the past  She reports having occasional BMs with bright red blood for months now when she strain too much but the bleeding usually resolves on its own. She has been straining lately. She takes Miralax daily with 6 small bowel movements per day normally. She takes one low dose aspirin daily but denies other NSAID use. She denies melena, abdominal pain, lightheadness, fever, chills, N/V, or weight loss, pain with defecation or tenesmus.  She has a sacral wound that is healing well.   Of note, she had suprapubic catheters for ~4-5 years now, originally placed by her Gynecologist; her current suprapubic catheter was changed last week.    Meds: No current facility-administered medications on file prior to encounter.   Current Outpatient Prescriptions on File Prior to Encounter  Medication Sig Dispense Refill  . insulin aspart (NOVOLOG) 100 UNIT/ML injection Inject 19 Units into the skin 3 (three) times daily before meals.      . insulin detemir (LEVEMIR) 100 UNIT/ML injection Inject 60  Units into the skin every morning.      . metFORMIN (GLUCOPHAGE) 500 MG tablet Take 500 mg by mouth 2 (two) times daily with a meal.      In addition: Lasix 40mg  BID, Losartan 100mg  daily, and metoprolol 25mg  BID, Xanax 0.5mg  daily, Celexa 20mg  daily, Latanoprost opht sol both eyes daily qHS, Claritin 10mg  daily, Zocor 40mg  daily, Ambien 10mg  qHS, omeprazole  Allergies: Allergies as of 04/13/2012 - Review Complete 04/13/2012  Allergen Reaction Noted  . Ace inhibitors     Past Medical History  Diagnosis Date  . Hypertension   . Diabetes mellitus without complication   . CHF (congestive heart failure)   . Neurogenic bladder   . GERD (gastroesophageal reflux disease)   . Hyperlipidemia   . Depression   . H/O non-ST elevation myocardial infarction (NSTEMI)   . Aortic sclerosis   . Hx of colonic polyps   . Hypertension   . Hemorrhoids    Past Surgical History  Procedure Date  . Hysterectomy   . Laparoscopic cholecystectomy   . Suprapubic catheter placement   . Sacral decubitis debri   . Sacral decubitus ulcer excision    Family History  Problem Relation Age of Onset  . Lung cancer Brother     smoker   History   Social History  . Marital Status: Married    Spouse Name: N/A    Number of Children: N/A  . Years of Education: N/A   Occupational History  . Not on file.   Social  History Main Topics  . Smoking status: Never Smoker   . Smokeless tobacco: Not on file  . Alcohol Use: No  . Drug Use: No  . Sexually Active:    Other Topics Concern  . Not on file   Social History Narrative  . No narrative on file    Review of Systems: Constitutional: Denies fever, chills, diaphoresis, appetite change and fatigue.  HEENT: Denies photophobia, eye pain, redness, hearing loss, ear pain, congestion, sore throat, rhinorrhea, sneezing, mouth sores, trouble swallowing, neck pain, neck stiffness and tinnitus.  Respiratory: Denies SOB, DOE, cough, chest tightness, and wheezing.   Cardiovascular: Denies chest pain, palpitations and leg swelling.  Gastrointestinal: Denies nausea, vomiting, abdominal pain, diarrhea, constipation, blood in stool and abdominal distention.  Genitourinary: Denies dysuria, urgency, frequency, hematuria, flank pain and difficulty urinating.  Musculoskeletal: Denies myalgias, back pain, joint swelling, arthralgias and gait problem.  Skin: Denies pallor, rash and wound.  Neurological: Denies dizziness, seizures, syncope, weakness, light-headedness, numbness and headaches.  Hematological: Denies adenopathy. Easy bruising, personal or family bleeding history  Psychiatric/Behavioral: Denies suicidal ideation, mood changes, confusion, nervousness, sleep disturbance and agitation   Physical Exam: Blood pressure 159/53, pulse 89, temperature 98.7 F (37.1 C), temperature source Oral, resp. rate 20, SpO2 95.00%. General: alert, well-developed, and cooperative to examination.  Head: normocephalic and atraumatic.  Eyes: vision grossly intact, pupils equal, pupils round, pupils reactive to light, no injection and anicteric.  Mouth: pharynx pink and moist, no erythema, and no exudates.  Neck: supple, full ROM, no thyromegaly, no JVD, and no carotid bruits.  Lungs: normal respiratory effort, no accessory muscle use, normal breath sounds, no crackles, and no wheezes. Heart: normal rate, regular rhythm, 2/6 holosystolic murmur heard best at the LUSB, murmur radiates to RUSB and apex, no gallop, and no rub.  Abdomen: soft, non-tender, normal bowel sounds, no distention, no guarding, no rebound tenderness, no hepatomegaly, and no splenomegaly. Suprapubic catheter in place with no surrounding edema or erythema.  Rectal exam: no external hemorrhoid, several small internal hemorrhoids present, no fissure, no anal abscess, small amount of bright red blood with stool noted on exam glove. Msk: no joint swelling, no joint warmth, and no redness over joints. Noted  sacral ulcer that is mostly well healed with one scab near the lower  Pulses: 2+ DP/PT pulses bilaterally Extremities: No cyanosis, clubbing, trace pitting edema bilaterally Neurologic: alert & oriented X3, cranial nerves II-XII intact, strength normal in all extremities, sensation intact to light touch Skin: turgor normal and no rashes.  Psych: Oriented X3, memory intact for recent and remote, normally interactive, good eye contact, not anxious appearing, and not depressed appearing.  Lab results: Basic Metabolic Panel:  [facility] 04/13/12 1748  NA 137  K 4.1  CL 99  CO2 27  GLUCOSE 140*  BUN 20  CREATININE 0.67  CALCIUM 9.6  MG --  PHOS --   CBC:  Basename 04/13/12 1748  WBC 8.1  NEUTROABS 5.2  HGB 12.7  HCT 37.9  MCV 84.4  PLT 123*   Coagulation:  Basename 04/13/12 1748  LABPROT 14.3  INR 1.13    Assessment & Plan by Problem:  1. GI bleed: Painless rectal bleed. Etiology is unclear, this is likely LGIB although UGIB cannot be excluded. For lower GI bleed, possible causes to include diverticua bleed (though she has no abdominal pain and no hx of diverticulosis), hemorrhoidal bleed (most likely, and she does have internal hemorrhoids with hx of hemorrhoid surgical repair, and 6  BM daily with straining), or AVM (although unlikely as brisk bleeding with significant hemoglobin drop and hemodynamic changes are currently not noted). Colorectal cancer also to be considered given that she has frequent small BMs and we do not have recent colonoscopy with self-reported history of colonic polyps. In addition, she denies weight loss or decreased appetite, signs commonly seen with malignancy. Upper GI bleed secondary to PUD also a possibility with brisk bleedas the patient takes ASA 81mg  daily--she denies use of other NSAIDs and she is on a PPI at home. Her Hemoglobin is stable at 12.7, her baseline is ~12, perhaps slight hemoconcentration (BUN:Cr >20). She is hemodynamically stable at  this time.  -Admit to Telemetry -Protonix IV BID -Orthostatic vitals -CBC now then q8hr -In the setting of GI bleed will hold ASA -IVF at 47ml/hr -NPO if actively bleeding -Consult to GI in AM for possible colonoscopy and EGD--she states being seen by [facility] years ago when she had her first and only colonoscopy  2. CHF. Last 2D echo in 2008 with no EF reported but mild to moderate aortic sclerosis, and mild thickening of the mitral valve. Her CXR from 2008 showed cardiomegaly, vascular congestion, and mild edema. On physical exam she does have trace pitting edema in her LE but no lung crackles, she does seem more euvolemic but this could be secondary to her acute GI bleed.  -CXR -pro-BNP -Gentle IV hydration -Will hold Lasix, metoprolol, losartan, and ASA in the setting of GI bleed  3. DM II. Her last Hgb A1C per [facility] records was 7.4% in June 2013. She is on Levemir 60 units daily, Novolog 18 units TID ac, and metformin 1000mg  BID.  -Levemir 40 units daily -Novolog 10 units TID ac -Hold metformin in setting of GI bleed, possible dehydration  4. HTN: On home Lasix 40mg  BID, Losartan 100mg  daily, and metoprolol 25mg  BID. Will hold these medications for now in the setting of acute GI bleed.   5. Sacral Ulcer. She has had stage IV sacral ulcers but now is currently healing from a stage II sacral ulcer. [facility]. On physical exam the wound is almost completely healed.  -Consider Wound Care consult -PT/OT for mobilization once H/H and GI bleed stable(she uses a walker at home)  DVT ppx: SCDs  Dispo: Disposition is deferred at this time, awaiting improvement of current medical problems. Anticipated discharge in approximately 2-3 day(s).   The patient does have a current PCP (Thane Edu, MD), therefore will not be requiring OPC follow-up after discharge.   The patient does not have transportation limitations that hinder transportation  to clinic appointments.  SignedKy Barban 04/13/2012, 11:08 PM

## 2012-04-14 ENCOUNTER — Encounter (HOSPITAL_COMMUNITY): Payer: Self-pay | Admitting: Gastroenterology

## 2012-04-14 LAB — CBC WITH DIFFERENTIAL/PLATELET
Basophils Absolute: 0 10*3/uL (ref 0.0–0.1)
Basophils Relative: 0 % (ref 0–1)
Eosinophils Absolute: 0.2 10*3/uL (ref 0.0–0.7)
Eosinophils Relative: 3 % (ref 0–5)
Eosinophils Relative: 3 % (ref 0–5)
HCT: 34.4 % — ABNORMAL LOW (ref 36.0–46.0)
HCT: 37.8 % (ref 36.0–46.0)
Hemoglobin: 12.6 g/dL (ref 12.0–15.0)
Lymphs Abs: 2.1 10*3/uL (ref 0.7–4.0)
MCH: 28.1 pg (ref 26.0–34.0)
MCHC: 32.8 g/dL (ref 30.0–36.0)
MCHC: 33.3 g/dL (ref 30.0–36.0)
MCV: 84.1 fL (ref 78.0–100.0)
MCV: 84.2 fL (ref 78.0–100.0)
Monocytes Absolute: 0.4 10*3/uL (ref 0.1–1.0)
Monocytes Absolute: 0.5 10*3/uL (ref 0.1–1.0)
Monocytes Relative: 7 % (ref 3–12)
Platelets: 98 10*3/uL — ABNORMAL LOW (ref 150–400)
RBC: 4.49 MIL/uL (ref 3.87–5.11)
RDW: 14 % (ref 11.5–15.5)

## 2012-04-14 LAB — GLUCOSE, CAPILLARY
Glucose-Capillary: 200 mg/dL — ABNORMAL HIGH (ref 70–99)
Glucose-Capillary: 206 mg/dL — ABNORMAL HIGH (ref 70–99)
Glucose-Capillary: 160 mg/dL — ABNORMAL HIGH (ref 70–99)

## 2012-04-14 LAB — HEMOGLOBIN A1C: Hgb A1c MFr Bld: 7.9 % — ABNORMAL HIGH (ref <5.7)

## 2012-04-14 MED ORDER — PEG 3350-KCL-NA BICARB-NACL 420 G PO SOLR
4000.0000 mL | Freq: Once | ORAL | Status: AC
  Administered 2012-04-14: 4000 mL via ORAL
  Filled 2012-04-14: qty 4000

## 2012-04-14 MED ORDER — PANTOPRAZOLE SODIUM 40 MG PO TBEC
40.0000 mg | DELAYED_RELEASE_TABLET | Freq: Two times a day (BID) | ORAL | Status: DC
  Administered 2012-04-14: 40 mg via ORAL
  Filled 2012-04-14: qty 1

## 2012-04-14 MED ORDER — SODIUM CHLORIDE 0.9 % IV SOLN
INTRAVENOUS | Status: DC
  Administered 2012-04-14: 20:00:00 via INTRAVENOUS
  Administered 2012-04-15: 500 mL via INTRAVENOUS
  Administered 2012-04-15: 1000 mL via INTRAVENOUS

## 2012-04-14 MED ORDER — MENTHOL 3 MG MT LOZG
1.0000 | LOZENGE | OROMUCOSAL | Status: DC | PRN
  Filled 2012-04-14: qty 9

## 2012-04-14 NOTE — Progress Notes (Signed)
Utilization review completed.  

## 2012-04-14 NOTE — ED Provider Notes (Signed)
I saw and evaluated the patient, reviewed the resident's note and I agree with the findings and plan.  Pt with lower GI bleeding, Hgb and vitals stable, admit for obs.  Charles B. Bernette Mayers, MD 04/14/12 [phone]

## 2012-04-14 NOTE — H&P (Signed)
Internal Medicine Teaching Service Attending Note Date: 04/14/2012  Patient name: Kelsey Williams  Medical record number: [id number]  Date of birth: 1946-03-14   I have seen and evaluated Kelsey Williams and discussed their care with the Residency Team. Please see Dr Jorje Guild H&P for full details. Ms Hedges was admitted for painless BRBPR. She has been eval by GI and a colonoscopy is planned for the AM. She last had a colonoscopy in 1998 and two polyps were removed and hemorrhoids were treated. Afterwards, she strained too hard and had repeat BRBPR and per pt required repeat scope and PRBC. PT uses miralax daily even though she has multiple stools daily but she feels incomplete evacuation.   She has had a suprapubic catheter for 5 yrs. IT was inserted 2/2 deconditioning and inability to get to toilet on time. Since then, her physical status has improved but she states her MD's do not want to remove the cathter yet bc her walking speed is still too slow to get to toilet on time.   [facility] and gets PT, takes shower. She wants to loose weight and change her eating but doesn't know how to handle the insulin changes. There is a dietician at [facility] and she was encouraged to enlist her services.   PMHx, meds, allergies, soc hx, fam hx, and ROS were reviewed.   PE Vitals are stable but was orthostatic HRRR with systolic murmur LCTAB ABD + BS, S/NT/ND Ext no edema  Labs and imaging were reviewed   Assessment and Plan: I agree with the formulated Assessment and Plan with the following changes:   1. BRBPR - for colonoscopy in AM. HgB and vitals are stable.  2. Other med problems per Dr Jorje Guild note  Dispo - likely 6th or 7th  Burns Spain, MD 12/4/20135:39 PM

## 2012-04-14 NOTE — Progress Notes (Signed)
Chaplain visited patient in response to an order for a Spiritual Care Consult.  Patient shared her story at length.  Chaplain provided spiritual support and encouragement and prayed with the patient.

## 2012-04-14 NOTE — Consult Note (Signed)
WOC consult Note Reason for Consult: Consult requested for sacrum wound.  Heels with intact skin. Pt has pink dry scar tissue from healed previous wound to sacrum and buttocks.  Small scab removes easily.  No open wound or drainage.  Pt states she was previously followed by the outpatient wound care center and they are no longer assessing wound since it is healed.  They recommended barrier cream to protect skin from constant shear when getting up to wheelchair.  Continue present plan of care. Will not plan to follow further unless re-consulted.  [address] RN, MSN, [employer]  [phone]

## 2012-04-14 NOTE — Consult Note (Signed)
Reason for Consult: GI bleed Referring Physician: Hospital team  Kelsey Williams is an 66 y.o. female.  HPI: Patient with bright red blood per rectum for 2 days and a history of a colonoscopy years ago with possible rectal polyp and hemorrhoids done in a different city and a family history of polyps and diverticuli but no ulcers or colon cancer and up until her bleeding she's had no GI symptoms and her lower bowels have been normal and she is had no upper tract symptoms but is on an aspirin a day and supposedly had no problems with her colonoscopy and was not told she needed a followup and her sister only recently was diagnosed with her polyp and she has no other complaints Past Medical History  Diagnosis Date  . Hypertension   . Diabetes mellitus without complication   . CHF (congestive heart failure)   . Neurogenic bladder   . GERD (gastroesophageal reflux disease)   . Hyperlipidemia   . Depression   . H/O non-ST elevation myocardial infarction (NSTEMI)   . Aortic sclerosis   . Hx of colonic polyps   . Hypertension   . Hemorrhoids     Past Surgical History  Procedure Date  . Hysterectomy   . Laparoscopic cholecystectomy   . Suprapubic catheter placement   . Sacral decubitis debri   . Sacral decubitus ulcer excision     Family History  Problem Relation Age of Onset  . Lung cancer Brother     smoker    Social History:  reports that she has never smoked. She does not have any smokeless tobacco history on file. She reports that she does not drink alcohol or use illicit drugs.  Allergies:  Allergies  Allergen Reactions  . Ace Inhibitors     REACTION: mental status changes    Medications: I have reviewed the patient's current medications.  Results for orders placed during the hospital encounter of 04/13/12 (from the past 48 hour(s))  CBC WITH DIFFERENTIAL     Status: Abnormal   Collection Time   04/13/12  5:48 PM      Component Value Range Comment   WBC 8.1  4.0 - 10.5  K/uL    RBC 4.49  3.87 - 5.11 MIL/uL    Hemoglobin 12.7  12.0 - 15.0 g/dL    HCT 16.1  09.6 - 04.5 %    MCV 84.4  78.0 - 100.0 fL    MCH 28.3  26.0 - 34.0 pg    MCHC 33.5  30.0 - 36.0 g/dL    RDW 40.9  81.1 - 91.4 %    Platelets 123 (*) 150 - 400 K/uL    Neutrophils Relative 64  43 - 77 %    Neutro Abs 5.2  1.7 - 7.7 K/uL    Lymphocytes Relative 25  12 - 46 %    Lymphs Abs 2.0  0.7 - 4.0 K/uL    Monocytes Relative 8  3 - 12 %    Monocytes Absolute 0.6  0.1 - 1.0 K/uL    Eosinophils Relative 3  0 - 5 %    Eosinophils Absolute 0.2  0.0 - 0.7 K/uL    Basophils Relative 0  0 - 1 %    Basophils Absolute 0.0  0.0 - 0.1 K/uL   BASIC METABOLIC PANEL     Status: Abnormal   Collection Time   04/13/12  5:48 PM      Component Value Range Comment   Sodium  137  135 - 145 mEq/L    Potassium 4.1  3.5 - 5.1 mEq/L    Chloride 99  96 - 112 mEq/L    CO2 27  19 - 32 mEq/L    Glucose, Bld 140 (*) 70 - 99 mg/dL    BUN 20  6 - 23 mg/dL    Creatinine, Ser 1.61  0.50 - 1.10 mg/dL    Calcium 9.6  8.4 - 09.6 mg/dL    GFR calc non Af Amer >90  >90 mL/min    GFR calc Af Amer >90  >90 mL/min   PROTIME-INR     Status: Normal   Collection Time   04/13/12  5:48 PM      Component Value Range Comment   Prothrombin Time 14.3  11.6 - 15.2 seconds    INR 1.13  0.00 - 1.49   CBC WITH DIFFERENTIAL     Status: Abnormal   Collection Time   04/13/12 10:40 PM      Component Value Range Comment   WBC 8.5  4.0 - 10.5 K/uL    RBC 4.34  3.87 - 5.11 MIL/uL    Hemoglobin 12.1  12.0 - 15.0 g/dL    HCT 04.5  40.9 - 81.1 %    MCV 84.6  78.0 - 100.0 fL    MCH 27.9  26.0 - 34.0 pg    MCHC 33.0  30.0 - 36.0 g/dL    RDW 91.4  78.2 - 95.6 %    Platelets 127 (*) 150 - 400 K/uL    Neutrophils Relative 63  43 - 77 %    Neutro Abs 5.3  1.7 - 7.7 K/uL    Lymphocytes Relative 28  12 - 46 %    Lymphs Abs 2.4  0.7 - 4.0 K/uL    Monocytes Relative 7  3 - 12 %    Monocytes Absolute 0.6  0.1 - 1.0 K/uL    Eosinophils Relative 2  0  - 5 %    Eosinophils Absolute 0.2  0.0 - 0.7 K/uL    Basophils Relative 0  0 - 1 %    Basophils Absolute 0.0  0.0 - 0.1 K/uL   HEMOGLOBIN A1C     Status: Abnormal   Collection Time   04/13/12 10:40 PM      Component Value Range Comment   Hemoglobin A1C 7.9 (*) <5.7 %    Mean Plasma Glucose 180 (*) <117 mg/dL   PRO B NATRIURETIC PEPTIDE     Status: Abnormal   Collection Time   04/13/12 10:40 PM      Component Value Range Comment   Pro B Natriuretic peptide (BNP) 318.3 (*) 0 - 125 pg/mL   GLUCOSE, CAPILLARY     Status: Abnormal   Collection Time   04/13/12 11:33 PM      Component Value Range Comment   Glucose-Capillary 179 (*) 70 - 99 mg/dL   CBC WITH DIFFERENTIAL     Status: Abnormal   Collection Time   04/14/12  6:51 AM      Component Value Range Comment   WBC 5.4  4.0 - 10.5 K/uL    RBC 4.09  3.87 - 5.11 MIL/uL    Hemoglobin 11.3 (*) 12.0 - 15.0 g/dL    HCT 21.3 (*) 08.6 - 46.0 %    MCV 84.1  78.0 - 100.0 fL    MCH 27.6  26.0 - 34.0 pg    MCHC 32.8  30.0 -  36.0 g/dL    RDW 91.4  78.2 - 95.6 %    Platelets 98 (*) 150 - 400 K/uL PLATELET COUNT CONFIRMED BY SMEAR   Neutrophils Relative 65  43 - 77 %    Neutro Abs 3.5  1.7 - 7.7 K/uL    Lymphocytes Relative 26  12 - 46 %    Lymphs Abs 1.4  0.7 - 4.0 K/uL    Monocytes Relative 7  3 - 12 %    Monocytes Absolute 0.4  0.1 - 1.0 K/uL    Eosinophils Relative 3  0 - 5 %    Eosinophils Absolute 0.1  0.0 - 0.7 K/uL    Basophils Relative 0  0 - 1 %    Basophils Absolute 0.0  0.0 - 0.1 K/uL   GLUCOSE, CAPILLARY     Status: Abnormal   Collection Time   04/14/12  8:12 AM      Component Value Range Comment   Glucose-Capillary 210 (*) 70 - 99 mg/dL     Dg Chest 2 View  21/07/863  *RADIOLOGY REPORT*  Clinical Data: Admission chest radiograph; history of vascular congestion.  CHEST - 2 VIEW  Comparison: Chest radiograph performed 07/06/2009, and CTA of the chest from 07/11/2009  Findings: The lungs are well-aerated and clear.  Pulmonary  vascularity is at the upper limits of normal.  There is no evidence of focal opacification, pleural effusion or pneumothorax.  The heart is borderline normal in size; the mediastinal contour is within normal limits.  No acute osseous abnormalities are seen. Clips are noted within the right upper quadrant, reflecting prior cholecystectomy.  IMPRESSION: No acute cardiopulmonary process seen.   Original Report Authenticated By: Tonia Ghent, M.D.     ROS negative except above although she did say she was allergic to any medicine with her last surgery in 2008 Blood pressure 187/94, pulse 91, temperature 98.4 F (36.9 C), temperature source Oral, resp. rate 20, SpO2 98.00%. Physical Exam vital signs stable afebrile no acute distress lungs are clear regular rate and rhythm abdomen is soft nontender labs reviewed hemoglobin fairly stable BUN normal  Assessment/Plan: Bright red blood per rectum in a patient due for colonoscopy Plan: The risks benefits methods of colonoscopy was discussed and will proceed tomorrow at 9 AM with further workup and plans pending those findings  Melvin Marmo E 04/14/2012, 11:38 AM

## 2012-04-14 NOTE — Progress Notes (Signed)
Physical Therapy Evaluation Patient Details Name: Kelsey Williams MRN: [id number] DOB: [birth year] Today's Date: 04/14/2012 Time: 4098-1191 PT Time Calculation (min): 31 min  PT Assessment / Plan / Recommendation Clinical Impression  66 yo admitted with GIB presents with decr functional mobility; should progress well back to PLOF, including walking with RW; Will benefit from PT to maximize independence and safety with mobility, amb to enable safe dc home, and resume services at [facility]    PT Assessment  Patient needs continued PT services    Follow Up Recommendations  Outpatient PT ([facility] to [facility] )    Does the patient have the potential to tolerate intense rehabilitation      Barriers to Discharge None      Equipment Recommendations  None recommended by PT    Recommendations for Other Services OT consult   Frequency Min 3X/week    Precautions / Restrictions Precautions Precautions: Fall   Pertinent Vitals/Pain no apparent distress       Mobility  Bed Mobility Bed Mobility: Rolling Right;Right Sidelying to Sit;Sitting - Scoot to Edge of Bed Rolling Right: 4: Min guard;With rail (with and without phsyical contact) Right Sidelying to Sit: 4: Min guard;With rails;HOB elevated Sitting - Scoot to Edge of Bed: 4: Min guard;With rail Details for Bed Mobility Assistance: Overall smooth transition Transfers Transfers: Sit to Stand;Stand to Sit;Stand Pivot Transfers Sit to Stand: 4: Min guard;With upper extremity assist Stand to Sit: 4: Min guard;With upper extremity assist Stand Pivot Transfers: 4: Min guard;With armrests Details for Transfer Assistance: Overall smooth transitions; Minguard for safety Ambulation/Gait Ambulation/Gait Assistance: Other (comment) (pt politley declined)    Shoulder Instructions     Exercises     PT Diagnosis:    PT Problem List: Decreased strength;Decreased activity tolerance;Decreased balance;Decreased mobility;Decreased knowledge of use of  DME;Decreased knowledge of precautions;Decreased skin integrity PT Treatment Interventions: DME instruction;Gait training;Functional mobility training;Therapeutic activities;Therapeutic exercise;Balance training;Patient/family education;Wheelchair mobility training   PT Goals Acute Rehab PT Goals PT Goal Formulation: With patient Time For Goal Achievement: 04/28/12 Potential to Achieve Goals: Good Pt will go Supine/Side to Sit: with modified independence PT Goal: Supine/Side to Sit - Progress: Goal set today Pt will go Sit to Supine/Side: with modified independence PT Goal: Sit to Supine/Side - Progress: Goal set today Pt will go Sit to Stand: with modified independence PT Goal: Sit to Stand - Progress: Goal set today Pt will go Stand to Sit: with modified independence PT Goal: Stand to Sit - Progress: Goal set today Pt will Transfer Bed to Chair/Chair to Bed: with modified independence PT Transfer Goal: Bed to Chair/Chair to Bed - Progress: Goal set today Pt will Ambulate: 51 - 150 feet;with modified independence;with rolling walker PT Goal: Ambulate - Progress: Goal set today  Visit Information  Last PT Received On: 04/14/12 Assistance Needed: +1    Subjective Data  Subjective: Agreeable to OOB; Reports is proud of the work she has done with PT and OT at [facility] Patient Stated Goal: back to normal   Prior Functioning  Home Living Lives With: Spouse Available Help at Discharge: Family;Available 24 hours/day Type of Home: House Home Access: Ramped entrance Home Layout: One level Bathroom Shower/Tub: Other (comment) (Uses walk-in shower at [facility] 3x/week) Home Adaptive Equipment: Walker - rolling;Wheelchair - manual;Hospital bed (has a good pressure relieving cushion in chair; Will need to verify bathroom equipment Prior Function Level of Independence: Needs assistance Needs Assistance: Bathing Bath: Supervision/set-up Able to Take Stairs?: No Driving:  No Communication Communication:  No difficulties    Cognition  Overall Cognitive Status: Appears within functional limits for tasks assessed/performed Arousal/Alertness: Awake/alert Orientation Level: Appears intact for tasks assessed Behavior During Session: [facility] for tasks performed    Extremity/Trunk Assessment Right Upper Extremity Assessment RUE ROM/Strength/Tone: [facility] for tasks assessed Left Upper Extremity Assessment LUE ROM/Strength/Tone: WFL for tasks assessed Right Lower Extremity Assessment RLE ROM/Strength/Tone: Deficits RLE ROM/Strength/Tone Deficits: Generalized weakness, with dependence on UE support for sit to stand Left Lower Extremity Assessment LLE ROM/Strength/Tone: Deficits LLE ROM/Strength/Tone Deficits: See RLE deficits   Balance    End of Session PT - End of Session Equipment Utilized During Treatment: Gait belt Activity Tolerance: Patient tolerated treatment well Patient left: in chair;with call bell/phone within reach Nurse Communication: Mobility status  GP     Olen Pel [place], [place] [phone]  04/14/2012, 3:49 PM

## 2012-04-15 ENCOUNTER — Encounter (HOSPITAL_COMMUNITY): Payer: Self-pay | Admitting: Gastroenterology

## 2012-04-15 ENCOUNTER — Encounter (HOSPITAL_COMMUNITY): Admission: EM | Disposition: A | Discharge status: Home / Self Care | Payer: Self-pay | Source: Home / Self Care

## 2012-04-15 HISTORY — PX: COLONOSCOPY: SHX5424

## 2012-04-15 LAB — CBC WITH DIFFERENTIAL/PLATELET
Hemoglobin: 9.9 g/dL — ABNORMAL LOW (ref 12.0–15.0)
Lymphocytes Relative: 29 % (ref 12–46)
Lymphs Abs: 1.9 10*3/uL (ref 0.7–4.0)
MCH: 27.6 pg (ref 26.0–34.0)
Monocytes Relative: 7 % (ref 3–12)
Neutro Abs: 4.1 10*3/uL (ref 1.7–7.7)
Neutrophils Relative %: 62 % (ref 43–77)
Platelets: 100 10*3/uL — ABNORMAL LOW (ref 150–400)
RBC: 3.59 MIL/uL — ABNORMAL LOW (ref 3.87–5.11)
WBC: 6.6 10*3/uL (ref 4.0–10.5)

## 2012-04-15 LAB — URINALYSIS, ROUTINE W REFLEX MICROSCOPIC
Glucose, UA: NEGATIVE mg/dL
Ketones, ur: NEGATIVE mg/dL
Nitrite: POSITIVE — AB
Specific Gravity, Urine: 1.017 (ref 1.005–1.030)
pH: 6.5 (ref 5.0–8.0)

## 2012-04-15 LAB — GLUCOSE, CAPILLARY
Glucose-Capillary: 180 mg/dL — ABNORMAL HIGH (ref 70–99)
Glucose-Capillary: 247 mg/dL — ABNORMAL HIGH (ref 70–99)

## 2012-04-15 LAB — URINE MICROSCOPIC-ADD ON

## 2012-04-15 LAB — BASIC METABOLIC PANEL
GFR calc Af Amer: >90 mL/min (ref >90)
GFR calc non Af Amer: >90 mL/min (ref >90)
Glucose, Bld: 204 mg/dL — ABNORMAL HIGH (ref 70–99)
Potassium: 4.1 mEq/L (ref 3.5–5.1)
Sodium: 140 mEq/L (ref 135–145)

## 2012-04-15 SURGERY — COLONOSCOPY
Anesthesia: Moderate Sedation

## 2012-04-15 MED ORDER — CITALOPRAM HYDROBROMIDE 20 MG PO TABS: 20.0000 mg | ORAL_TABLET | Freq: Every day | ORAL | Status: DC

## 2012-04-15 MED ORDER — SIMVASTATIN 40 MG PO TABS: 40.0000 mg | ORAL_TABLET | Freq: Every day | ORAL | Status: DC

## 2012-04-15 MED ORDER — MENTHOL (TOPICAL ANALGESIC) 4 % EX GEL: 1.0000 "application " | Freq: Four times a day (QID) | CUTANEOUS | Status: DC | PRN

## 2012-04-15 MED ORDER — INSULIN ASPART 100 UNIT/ML ~~LOC~~ SOLN
0.0000 [IU] | Freq: Three times a day (TID) | SUBCUTANEOUS | Status: DC
  Administered 2012-04-15: 19 [IU] via SUBCUTANEOUS

## 2012-04-15 MED ORDER — ZOLPIDEM TARTRATE 5 MG PO TABS: 10.0000 mg | ORAL_TABLET | Freq: Every day | ORAL | Status: DC

## 2012-04-15 MED ORDER — MIDAZOLAM HCL 5 MG/ML IJ SOLN
INTRAMUSCULAR | Status: AC
  Filled 2012-04-15: qty 3

## 2012-04-15 MED ORDER — FUROSEMIDE 40 MG PO TABS: 40.0000 mg | ORAL_TABLET | Freq: Two times a day (BID) | ORAL | Status: DC

## 2012-04-15 MED ORDER — METOPROLOL TARTRATE 25 MG PO TABS
25.0000 mg | ORAL_TABLET | Freq: Two times a day (BID) | ORAL | Status: DC
  Administered 2012-04-15: 25 mg via ORAL
  Filled 2012-04-15 (×2): qty 1

## 2012-04-15 MED ORDER — POLYETHYLENE GLYCOL 3350 17 G PO PACK
17.0000 g | PACK | Freq: Every day | ORAL | Status: DC
  Administered 2012-04-15: 17 g via ORAL
  Filled 2012-04-15: qty 1

## 2012-04-15 MED ORDER — ACETAMINOPHEN 500 MG PO TABS
500.0000 mg | ORAL_TABLET | Freq: Two times a day (BID) | ORAL | Status: DC
  Administered 2012-04-15: 500 mg via ORAL
  Filled 2012-04-15 (×2): qty 1

## 2012-04-15 MED ORDER — FENTANYL CITRATE 0.05 MG/ML IJ SOLN
INTRAMUSCULAR | Status: DC | PRN
  Administered 2012-04-15 (×3): 25 ug via INTRAVENOUS

## 2012-04-15 MED ORDER — TRIAMCINOLONE ACETONIDE 0.1 % EX CREA
1.0000 "application " | TOPICAL_CREAM | Freq: Two times a day (BID) | CUTANEOUS | Status: DC
  Administered 2012-04-15: 1 via TOPICAL
  Filled 2012-04-15: qty 15

## 2012-04-15 MED ORDER — POLYETHYLENE GLYCOL 3350 17 G PO PACK: 17.0000 g | PACK | Freq: Two times a day (BID) | ORAL | Status: AC

## 2012-04-15 MED ORDER — LORATADINE 10 MG PO TABS: 10.0000 mg | ORAL_TABLET | Freq: Every day | ORAL | Status: DC

## 2012-04-15 MED ORDER — INSULIN ASPART 100 UNIT/ML ~~LOC~~ SOLN: 19.0000 [IU] | Freq: Three times a day (TID) | SUBCUTANEOUS | Status: DC

## 2012-04-15 MED ORDER — DIPHENHYDRAMINE HCL 50 MG/ML IJ SOLN
INTRAMUSCULAR | Status: DC | PRN
  Administered 2012-04-15: 12.5 mg via INTRAVENOUS

## 2012-04-15 MED ORDER — CAMPHOR-MENTHOL 0.5-0.5 % EX LOTN: TOPICAL_LOTION | Freq: Four times a day (QID) | CUTANEOUS | Status: DC | PRN

## 2012-04-15 MED ORDER — MIDAZOLAM HCL 5 MG/5ML IJ SOLN
INTRAMUSCULAR | Status: DC | PRN
  Administered 2012-04-15: 1 mg via INTRAVENOUS
  Administered 2012-04-15 (×4): 2 mg via INTRAVENOUS

## 2012-04-15 MED ORDER — ALPRAZOLAM 0.5 MG PO TABS: 0.5000 mg | ORAL_TABLET | Freq: Every day | ORAL | Status: DC

## 2012-04-15 MED ORDER — BENZONATATE 100 MG PO CAPS: 200.0000 mg | ORAL_CAPSULE | Freq: Three times a day (TID) | ORAL | Status: DC | PRN

## 2012-04-15 MED ORDER — METFORMIN HCL 500 MG PO TABS: 1000.0000 mg | ORAL_TABLET | Freq: Two times a day (BID) | ORAL | Status: DC

## 2012-04-15 MED ORDER — PANTOPRAZOLE SODIUM 40 MG PO TBEC: 40.0000 mg | DELAYED_RELEASE_TABLET | Freq: Every day | ORAL | Status: DC

## 2012-04-15 MED ORDER — SODIUM CHLORIDE 0.9 % IV SOLN: INTRAVENOUS | Status: DC

## 2012-04-15 MED ORDER — FUROSEMIDE 40 MG PO TABS: 40.0000 mg | ORAL_TABLET | Freq: Two times a day (BID) | ORAL | Status: AC

## 2012-04-15 MED ORDER — BENZONATATE 100 MG PO CAPS
100.0000 mg | ORAL_CAPSULE | Freq: Three times a day (TID) | ORAL | Status: DC | PRN
  Filled 2012-04-15: qty 1

## 2012-04-15 MED ORDER — ADULT MULTIVITAMIN W/MINERALS CH: 1.0000 | ORAL_TABLET | Freq: Every day | ORAL | Status: DC

## 2012-04-15 MED ORDER — KETOCONAZOLE 2 % EX SHAM
1.0000 "application " | MEDICATED_SHAMPOO | Freq: Every day | CUTANEOUS | Status: DC
  Filled 2012-04-15: qty 120

## 2012-04-15 MED ORDER — LATANOPROST 0.005 % OP SOLN: 1.0000 [drp] | Freq: Every day | OPHTHALMIC | Status: DC

## 2012-04-15 MED ORDER — FENTANYL CITRATE 0.05 MG/ML IJ SOLN
INTRAMUSCULAR | Status: AC
Start: 2012-04-15 — End: 2012-04-15
  Filled 2012-04-15: qty 4

## 2012-04-15 MED ORDER — MICONAZOLE NITRATE 2 % EX POWD: 1.0000 "application " | CUTANEOUS | Status: DC | PRN

## 2012-04-15 MED ORDER — MICONAZOLE NITRATE POWD: Freq: Every day | Status: DC | PRN

## 2012-04-15 MED ORDER — ASPIRIN 81 MG PO CHEW: 81.0000 mg | CHEWABLE_TABLET | Freq: Every day | ORAL | Status: DC

## 2012-04-15 MED ORDER — FUROSEMIDE 80 MG PO TABS
80.0000 mg | ORAL_TABLET | Freq: Every day | ORAL | Status: DC
  Filled 2012-04-15: qty 1

## 2012-04-15 MED ORDER — FUROSEMIDE 40 MG PO TABS
40.0000 mg | ORAL_TABLET | Freq: Every day | ORAL | Status: DC
  Filled 2012-04-15: qty 1

## 2012-04-15 MED ORDER — SILVER NITRATE-POT NITRATE 75-25 % EX MISC
1.0000 "application " | CUTANEOUS | Status: DC | PRN
  Filled 2012-04-15: qty 1

## 2012-04-15 MED ORDER — METFORMIN HCL 500 MG PO TABS
1000.0000 mg | ORAL_TABLET | Freq: Two times a day (BID) | ORAL | Status: DC
  Filled 2012-04-15 (×2): qty 2

## 2012-04-15 MED ORDER — DIPHENHYDRAMINE HCL 50 MG/ML IJ SOLN
INTRAMUSCULAR | Status: AC
  Filled 2012-04-15: qty 1

## 2012-04-15 NOTE — Progress Notes (Signed)
Kelsey Williams 9:12 AM  Subjective: The patient did well with the prep but still sees a little bit of blood but her bowels are clear and other than her arm complaints from an old blood pressure cuff in the ER and some left hip pain she has no other complaints  Objective: Vital signs stable afebrile no acute distress exam please see pre-assessment evaluation hemoglobin stable  Assessment: Bright red blood per rectum  Plan: Okay to proceed with colonoscopy  [facility]

## 2012-04-15 NOTE — Progress Notes (Signed)
Pt has red marks on upper right arm. states she got it in the ED when she came in after taking blood pressure.

## 2012-04-15 NOTE — Op Note (Signed)
[facility] [address] [place], [address]   COLONOSCOPY PROCEDURE REPORT  PATIENT: Kelsey Williams, Kelsey Williams  MR#: [id number] BIRTHDATE: 1945-06-18 , 66  yrs. old GENDER: Female ENDOSCOPIST: Vida Rigger, MD REFERRED BY: PROCEDURE DATE:  04/15/2012 PROCEDURE:   Colonoscopy with biopsy and Colonoscopy with snare polypectomy ASA CLASS:   Class II INDICATIONS:Patient's family history of colon polyps and Rectal Bleeding. MEDICATIONS: Fentanyl 100 mcg IV, Versed 9 mg IV, and Benadryl 25 mg IV  DESCRIPTION OF PROCEDURE:   After the risks benefits and alternatives of the procedure were thoroughly explained, informed consent was obtained.  The Pentax Ped Colon [id number]  endoscope was introduced through the anus and advanced to the cecum, which was identified by both the appendix and ileocecal valve , limited by No adverse events experienced.   The quality of the prep was adequate. .  The instrument was then slowly withdrawn as the colon was fully examined.no blood was seen on insertion and no abdominal pressure or position changes was needed and the findings are recorded below and the patient tolerated the procedure well there was no obvious immediate complicationat the end of the procedure both the anal rectal pull-through and retroflexion was done and the scope was inserted a short waysup the left side of the colon and air and water was suctionedand the scope was removed       FINDINGS:  1. Internal/external hemorrhoids most likely cause of bleeding 2. 2 tiny rectal polyps cold biopsied 3. 2 small sigmoid polyps snare polypectomy 4. Small distal transverse lipoma biopsied 5. Small hepatic flexure polyp cold biopsy 6. Patch of descending inflammation doubt polyp status post biopsy 7. Otherwise within normal limits to the cecum  COMPLICATIONS: none  IMPRESSION:  above  RECOMMENDATIONS: await pathology to determine future colonic screening treat  hemorrhoids advance diet hopefully home soon questionably even today   _______________________________ eSigned:  Vida Rigger, MD 04/15/2012 10:14 AM   CC:  PATIENT NAME:  Kelsey Williams, Kelsey Williams MR#: [id number]

## 2012-04-15 NOTE — Discharge Summary (Signed)
Internal Medicine Teaching [facility] Discharge Note  Name: Kelsey Williams MRN: [id number] DOB: [birth year] 66 y.o.  Date of Admission: 04/13/2012  4:57 PM Date of Discharge: 04/15/2012 Attending Physician: Burns Spain, MD  Discharge Diagnosis: Principal Problem:  *Internal and external bleeding hemorrhoids Active Problems:  DIABETES MELLITUS, TYPE II  SYSTOLIC HYPERTENSION  Peripheral neuropathy  Neurogenic bladder  History of colon polyps   Discharge Medications:   Medication List     As of 04/15/2012  3:37 PM    STOP taking these medications         aspirin 81 MG chewable tablet      TAKE these medications         acetaminophen 500 MG tablet   Commonly known as: TYLENOL   Take 500 mg by mouth 2 (two) times daily.      ALPRAZolam 0.5 MG tablet   Commonly known as: XANAX   Take 0.5 mg by mouth daily.      benzonatate 200 MG capsule   Commonly known as: TESSALON   Take 200 mg by mouth 3 (three) times daily as needed. For cough      BIOFREEZE ROLL-ON 4 % Gel   Generic drug: Menthol (Topical Analgesic)   Apply 1 application topically 4 (four) times daily as needed. For pain      CARRASYN HYDROGEL WOUND DRESS EX   Apply 1 application topically 3 (three) times daily.      citalopram 20 MG tablet   Commonly known as: CELEXA   Take 20 mg by mouth daily.      furosemide 40 MG tablet   Commonly known as: LASIX   Take 1 tablet (40 mg total) by mouth 2 (two) times daily. Take 2 tabs in the morning and 1 tab in the evening      insulin aspart 100 UNIT/ML injection   Commonly known as: novoLOG   Inject 19 Units into the skin 3 (three) times daily before meals.      insulin detemir 100 UNIT/ML injection   Commonly known as: LEVEMIR   Inject 60 Units into the skin every morning.      ketoconazole 2 % shampoo   Commonly known as: NIZORAL   Apply 1 application topically daily. Shampoo scalp and use on face and ear canals      latanoprost 0.005 %  ophthalmic solution   Commonly known as: XALATAN   Place 1 drop into both eyes at bedtime.      loratadine 10 MG tablet   Commonly known as: CLARITIN   Take 10 mg by mouth daily.      losartan 100 MG tablet   Commonly known as: COZAAR   Take 100 mg by mouth daily.      metFORMIN 1000 MG tablet   Commonly known as: GLUCOPHAGE   Take 1,000 mg by mouth 2 (two) times daily with a meal.      metoprolol tartrate 25 MG tablet   Commonly known as: LOPRESSOR   Take 25 mg by mouth 2 (two) times daily.      MICRO GUARD 2 % powder   Generic drug: miconazole   Apply 1 application topically as needed. Apply under folds of skin for rash      multivitamin with minerals Tabs   Take 1 tablet by mouth daily.      pantoprazole 40 MG tablet   Commonly known as: PROTONIX   Take 40 mg by mouth daily.  polyethylene glycol packet   Commonly known as: MIRALAX / GLYCOLAX   Take 17 g by mouth 2 (two) times daily.      silver nitrate applicators 75-25 % applicator   Apply 1 application topically as needed. Apply to granulation tissue around stoma of supra pubic cath as needed      simvastatin 40 MG tablet   Commonly known as: ZOCOR   Take 40 mg by mouth at bedtime.      triamcinolone cream 0.1 %   Commonly known as: KENALOG   Apply 1 application topically 2 (two) times daily.      zolpidem 10 MG tablet   Commonly known as: AMBIEN   Take 10 mg by mouth at bedtime.        Disposition and follow-up:   Kelsey Williams was discharged from [facility] in stable condition.  At the hospital follow up visit please address   Please do repeat CBC for Hb to evaluate blood loss  The decision to treat for bacteriuria has been deferred to PCP   Please follow up results of polyp biopsy   Follow-up Appointments:     Follow-up Information    Follow up with Thane Edu, MD. (You will be contacted  as needed)    Contact information:   [address] [place] [address] [phone]         Discharge Orders    Future Orders Please Complete By Expires   Diet - low sodium heart healthy      Increase activity slowly      Call MD for:  temperature >100.4      Call MD for:  severe uncontrolled pain      Call MD for:  persistant nausea and vomiting         Consultations: Treatment Team:  Petra Kuba, MD  Procedures Performed:  Dg Chest 2 View  04/14/2012  *RADIOLOGY REPORT*  Clinical Data: Admission chest radiograph; history of vascular congestion.  CHEST - 2 VIEW  Comparison: Chest radiograph performed 07/06/2009, and CTA of the chest from 07/11/2009  Findings: The lungs are well-aerated and clear.  Pulmonary vascularity is at the upper limits of normal.  There is no evidence of focal opacification, pleural effusion or pneumothorax.  The heart is borderline normal in size; the mediastinal contour is within normal limits.  No acute osseous abnormalities are seen. Clips are noted within the right upper quadrant, reflecting prior cholecystectomy.  IMPRESSION: No acute cardiopulmonary process seen.   Original Report Authenticated By: Tonia Ghent, M.D.    Admission HPI:  Kelsey Williams is a 66 yo woman with PMH of DM II, hemorrhoids, NSTEMI in 2008, CHF, GERD, neurogenic bladder, history of colonic polyps, aortic stenosis, and paroxysmal A. Fib, in the [facility] who [facility] ED today for evaluation of painless bright red bleeding per rectum since last night with 6-7 BMs mixed with formed stools. She reports large amounts of blood, almost one pint per BM. In the past She reports having occasional BMs with bright red blood for months now when she strain too much but the bleeding usually resolves on its own. She has been straining lately. She takes Miralax daily with 6 small bowel movements per day normally. She takes one low dose aspirin daily but denies other NSAID use. She denies melena, abdominal pain, lightheadness, fever,  chills, N/V, or weight loss, pain with defecation or tenesmus. She has a sacral wound that  is healing well.  Of note, she had suprapubic catheters for ~4-5 years now, originally placed by her Gynecologist; her current suprapubic catheter was changed last week.   Physical Exam:  Blood pressure 159/53, pulse 89, temperature 98.7 F (37.1 C), temperature source Oral, resp. rate 20, SpO2 95.00%.  General: alert, well-developed, and cooperative to examination.  Head: normocephalic and atraumatic.  Eyes: vision grossly intact, pupils equal, pupils round, pupils reactive to light, no injection and anicteric.  Mouth: pharynx pink and moist, no erythema, and no exudates.  Neck: supple, full ROM, no thyromegaly, no JVD, and no carotid bruits.  Lungs: normal respiratory effort, no accessory muscle use, normal breath sounds, no crackles, and no wheezes. Heart: normal rate, regular rhythm, 2/6 holosystolic murmur heard best at the LUSB, murmur radiates to RUSB and apex, no gallop, and no rub.  Abdomen: soft, non-tender, normal bowel sounds, no distention, no guarding, no rebound tenderness, no hepatomegaly, and no splenomegaly. Suprapubic catheter in place with no surrounding edema or erythema.  Rectal exam: no external hemorrhoid, several small internal hemorrhoids present, no fissure, no anal abscess, small amount of bright red blood with stool noted on exam glove. Msk: no joint swelling, no joint warmth, and no redness over joints. Noted sacral ulcer that is mostly well healed with one scab near the lower  Pulses: 2+ DP/PT pulses bilaterally Extremities: No cyanosis, clubbing, trace pitting edema bilaterally  Neurologic: alert & oriented X3, cranial nerves II-XII intact, strength normal in all extremities, sensation intact to light touch  Skin: turgor normal and no rashes.  Psych: Oriented X3, memory intact for recent and remote, normally interactive, good eye contact, not anxious appearing, and not  depressed appearing.   Hospital Course by problem list:  1. Bleeding Internal and External hemorrhoids: Kelsey Williams was admitted with painless rectal bleed for one day. Etiology was unclear but the history and physical findings were very suggestive of this being from LGIB secondary to internal and external hemorrhoids. A possibility of this being from upper GI bleeding was also considered given her prolonged use of aspirin. However, this was unlikely from the history and physical examination. Other differentials considered included diverticulitis, even though she did not have a history of this. Aspirin was withheld during hospitalization, given this episode of GI bleeding. She continued with her Protonix over 40 mg once daily. Due to her advanced age and lack of a recent colonoscopy, further evaluation with endoscopy was pursued. Her hemoglobin level was closely monitored and remained stable at around a baseline of 12.5. However, following the endoscopy, her hemoglobin dropped to 9.9, possibly of do to intraprocedure bleeding and IVF. She did not have rectal bleeding at the time of discharge. She remained completely asymptomatic. Her vitals also remained stable. The colonoscopy revealed internal and external hemorrhoids as initially suspected, and multiple polyps were also identified in the multiple areas in the colon and were biopsied for histology. She was discharged on stable condition. I contacted Dr. Modena Jansky before she was discharged. She will followup with him as soon as possible. Her Miralax was increased to bid in order to easy bowel movements. She was instructed to resume once usual dose of once daily once she is happy with the BM. Dr Ewing Schlein will contact her with the results of the biopsy from colonoscopy.  2. Diabetes type 2. Her last Hgb A1C per [facility] records was 7.4% in June 2013. She is on Levemir 60 units daily, Novolog 18 units TID ac, and metformin 1000mg  BID.  The patient was continued on her home  medications, including Levemir 40 units daily   and Novolog 10 units TID ac. The metformin was withheld during her hospital stay.   3. Hypertension: Her blood pressure remained stable during hospitalization. The Lasix was withheld, but reassured and on discharge at a dose of 40 mg BID, Losartan 100mg  daily, and metoprolol 25mg  BID.   4. Asymptomatic Bacteriuria: Patient has a chronic indwelling catheter. She did no have symptoms of UTI however, a urinalysis showed bacteriuria. No treatment was given but deferred to PCP since she was asymptomatic.   5. Disposition: The patient was discharged in stable condition, and she'll followup with Dr. Modena Jansky who is her primary physician. Her home medication were restarted.   Discharge Vitals:  BP 142/61  Pulse 76  Temp 97.8 F (36.6 C) (Oral)  Resp 20  Ht 5' 2.5" (1.588 m)  Wt 167 lb (75.751 kg)  BMI 30.06 kg/m2  SpO2 96%  Discharge Labs:  Results for orders placed during the hospital encounter of 04/13/12 (from the past 24 hour(s))  GLUCOSE, CAPILLARY     Status: Abnormal   Collection Time   04/14/12  5:00 PM      Component Value Range   Glucose-Capillary 206 (*) 70 - 99 mg/dL  CBC WITH DIFFERENTIAL     Status: Abnormal   Collection Time   04/14/12  6:01 PM      Component Value Range   WBC 6.7  4.0 - 10.5 K/uL   RBC 4.49  3.87 - 5.11 MIL/uL   Hemoglobin 12.6  12.0 - 15.0 g/dL   HCT 16.1  09.6 - 04.5 %   MCV 84.2  78.0 - 100.0 fL   MCH 28.1  26.0 - 34.0 pg   MCHC 33.3  30.0 - 36.0 g/dL   RDW 40.9  81.1 - 91.4 %   Platelets 109 (*) 150 - 400 K/uL   Neutrophils Relative 59  43 - 77 %   Neutro Abs 3.9  1.7 - 7.7 K/uL   Lymphocytes Relative 31  12 - 46 %   Lymphs Abs 2.1  0.7 - 4.0 K/uL   Monocytes Relative 7  3 - 12 %   Monocytes Absolute 0.5  0.1 - 1.0 K/uL   Eosinophils Relative 3  0 - 5 %   Eosinophils Absolute 0.2  0.0 - 0.7 K/uL   Basophils Relative 0  0 - 1 %   Basophils Absolute 0.0  0.0 - 0.1 K/uL  GLUCOSE, CAPILLARY      Status: Abnormal   Collection Time   04/14/12  9:22 PM      Component Value Range   Glucose-Capillary 160 (*) 70 - 99 mg/dL  GLUCOSE, CAPILLARY     Status: Abnormal   Collection Time   04/15/12  7:38 AM      Component Value Range   Glucose-Capillary 161 (*) 70 - 99 mg/dL  URINALYSIS, ROUTINE W REFLEX MICROSCOPIC     Status: Abnormal   Collection Time   04/15/12 11:26 AM      Component Value Range   Color, Urine YELLOW  YELLOW   APPearance CLOUDY (*) CLEAR   Specific Gravity, Urine 1.017  1.005 - 1.030   pH 6.5  5.0 - 8.0   Glucose, UA NEGATIVE  NEGATIVE mg/dL   Hgb urine dipstick MODERATE (*) NEGATIVE   Bilirubin Urine NEGATIVE  NEGATIVE   Ketones, ur NEGATIVE  NEGATIVE mg/dL   Protein, ur NEGATIVE  NEGATIVE  mg/dL   Urobilinogen, UA 0.2  0.0 - 1.0 mg/dL   Nitrite POSITIVE (*) NEGATIVE   Leukocytes, UA LARGE (*) NEGATIVE  URINE MICROSCOPIC-ADD ON     Status: Abnormal   Collection Time   04/15/12 11:26 AM      Component Value Range   Squamous Epithelial / LPF RARE  RARE   WBC, UA TOO NUMEROUS TO COUNT  <3 WBC/hpf   RBC / HPF 7-10  <3 RBC/hpf   Bacteria, UA MANY (*) RARE   Urine-Other AMORPHOUS URATES/PHOSPHATES    GLUCOSE, CAPILLARY     Status: Abnormal   Collection Time   04/15/12 11:40 AM      Component Value Range   Glucose-Capillary 180 (*) 70 - 99 mg/dL  BASIC METABOLIC PANEL     Status: Abnormal   Collection Time   04/15/12 12:55 PM      Component Value Range   Sodium 140  135 - 145 mEq/L   Potassium 4.1  3.5 - 5.1 mEq/L   Chloride 106  96 - 112 mEq/L   CO2 26  19 - 32 mEq/L   Glucose, Bld 204 (*) 70 - 99 mg/dL   BUN 8  6 - 23 mg/dL   Creatinine, Ser 4.54  0.50 - 1.10 mg/dL   Calcium 8.4  8.4 - 09.8 mg/dL   GFR calc non Af Amer >90  >90 mL/min   GFR calc Af Amer >90  >90 mL/min  CBC WITH DIFFERENTIAL     Status: Abnormal   Collection Time   04/15/12  1:00 PM      Component Value Range   WBC 6.6  4.0 - 10.5 K/uL   RBC 3.59 (*) 3.87 - 5.11 MIL/uL   Hemoglobin  9.9 (*) 12.0 - 15.0 g/dL   HCT 11.9 (*) 14.7 - 82.9 %   MCV 85.0  78.0 - 100.0 fL   MCH 27.6  26.0 - 34.0 pg   MCHC 32.5  30.0 - 36.0 g/dL   RDW 56.2  13.0 - 86.5 %   Platelets 100 (*) 150 - 400 K/uL   Neutrophils Relative 62  43 - 77 %   Neutro Abs 4.1  1.7 - 7.7 K/uL   Lymphocytes Relative 29  12 - 46 %   Lymphs Abs 1.9  0.7 - 4.0 K/uL   Monocytes Relative 7  3 - 12 %   Monocytes Absolute 0.4  0.1 - 1.0 K/uL   Eosinophils Relative 3  0 - 5 %   Eosinophils Absolute 0.2  0.0 - 0.7 K/uL   Basophils Relative 0  0 - 1 %   Basophils Absolute 0.0  0.0 - 0.1 K/uL  GLUCOSE, CAPILLARY     Status: Abnormal   Collection Time   04/15/12  2:09 PM      Component Value Range   Glucose-Capillary 247 (*) 70 - 99 mg/dL    Signed: Dow Adolph 04/15/2012, 3:37 PM   Time Spent on Discharge: 

## 2012-04-15 NOTE — Discharge Summary (Signed)
Pt d/c'd home per [facility]. Belongings with pt. d/c summary complete and pt verbalizes understanding of all instructions. Supra pubic foley catheter in place and emptied. Iv and tel box d/c'd. Prescriptions with pt.

## 2012-04-15 NOTE — Progress Notes (Signed)
Subjective: The patient had a fair night. She does reports slightly blood stain BM but looks much improved. She will have a colonoscopy this morning. She denies any other complains.  Objective: Vital signs in last 24 hours: Filed Vitals:   04/15/12 1006 04/15/12 1015 04/15/12 1030 04/15/12 1400  BP:  147/66 138/59 142/61  Pulse:    76  Temp: 97.8 F (36.6 C)   97.8 F (36.6 C)  TempSrc: Oral   Oral  Resp:  20 24 20   Height:      Weight:      SpO2:  98% 94% 96%   Weight change:   Intake/Output Summary (Last 24 hours) at 04/15/12 1536 Last data filed at 04/15/12 1300  Gross per 24 hour  Intake 2924.25 ml  Output   1450 ml  Net 1474.25 ml   Physical Exam:  General: alert, well-developed, and cooperative to examination.  Head: normocephalic and atraumatic.  Eyes: vision grossly intact, pupils equal, pupils round, pupils reactive to light, no injection and anicteric.  Mouth: pharynx pink and moist, no erythema, and no exudates.  Neck: supple, full ROM, no thyromegaly, no JVD, and no carotid bruits.  Lungs: normal respiratory effort, no accessory muscle use, normal breath sounds, no crackles, and no wheezes. Heart: normal rate, regular rhythm, 2/6 holosystolic murmur heard best at the LUSB, murmur radiates to RUSB and apex, no gallop, and no rub.  Abdomen: soft, non-tender, normal bowel sounds, no distention, no guarding, no rebound tenderness, no hepatomegaly, and no splenomegaly. Suprapubic catheter in place with no surrounding edema or erythema.  Rectal exam: not repeated. Her sacral decubitus ulcer is almost healed.  Msk: no joint swelling, no joint warmth, and no redness over joints. Noted sacral ulcer that is mostly well healed with one scab near the lower  Pulses: 2+ DP/PT pulses bilaterally Extremities: No cyanosis, clubbing, trace pitting edema bilaterally  Neurologic: alert & oriented X3, cranial nerves II-XII intact, strength normal in all extremities, sensation intact to  light touch  Skin: turgor normal and no rashes.  Psych: Oriented X3, memory intact for recent and remote, normally interactive, good eye contact, not anxious appearing, and not depressed appearing.  Lab Results: Basic Metabolic Panel:  Lab 04/15/12 2952 04/13/12 1748  NA 140 137  K 4.1 4.1  CL 106 99  CO2 26 27  GLUCOSE 204* 140*  BUN 8 20  CREATININE 0.54 0.67  CALCIUM 8.4 9.6  MG -- --  PHOS -- --   Liver Function Tests:  Lab 04/14/12 1423  AST --  ALT --  ALKPHOS --  BILITOT --  PROT --  ALBUMIN 3.1*    Lab 04/15/12 1300 04/14/12 1801  WBC 6.6 6.7  NEUTROABS 4.1 3.9  HGB 9.9* 12.6  HCT 30.5* 37.8  MCV 85.0 84.2  PLT 100* 109*   BNP:  Lab 04/13/12 2240  PROBNP 318.3*   D-Dimer: No results found for this basename: DDIMER:2 in the last 168 hours CBG:  Lab 04/15/12 1409 04/15/12 1140 04/15/12 0738 04/14/12 2122 04/14/12 1700 04/14/12 1205  GLUCAP 247* 180* 161* 160* 206* 200*   Hemoglobin A1C:  Lab 04/13/12 2240  HGBA1C 7.9*   Fasting Lipid Panel: No results found for this basename: CHOL,HDL,LDLCALC,TRIG,CHOLHDL,LDLDIRECT in the last 841 hours Thyroid Function Tests: No results found for this basename: TSH,T4TOTAL,FREET4,T3FREE,THYROIDAB in the last 168 hours Coagulation:  Lab 04/13/12 1748  LABPROT 14.3  INR 1.13   AUrinalysis:  Lab 04/15/12 1126  COLORURINE YELLOW  LABSPEC 1.017  PHURINE 6.5  GLUCOSEU NEGATIVE  HGBUR MODERATE*  BILIRUBINUR NEGATIVE  KETONESUR NEGATIVE  PROTEINUR NEGATIVE  UROBILINOGEN 0.2  NITRITE POSITIVE*  LEUKOCYTESUR LARGE*    Studies/Results: Dg Chest 2 View  04/14/2012  *RADIOLOGY REPORT*  Clinical Data: Admission chest radiograph; history of vascular congestion.  CHEST - 2 VIEW  Comparison: Chest radiograph performed 07/06/2009, and CTA of the chest from 07/11/2009  Findings: The lungs are well-aerated and clear.  Pulmonary vascularity is at the upper limits of normal.  There is no evidence of focal  opacification, pleural effusion or pneumothorax.  The heart is borderline normal in size; the mediastinal contour is within normal limits.  No acute osseous abnormalities are seen. Clips are noted within the right upper quadrant, reflecting prior cholecystectomy.  IMPRESSION: No acute cardiopulmonary process seen.   Original Report Authenticated By: Tonia Ghent, M.D.    Medications: Scheduled Meds:   . acetaminophen  500 mg Oral BID  . ALPRAZolam  0.5 mg Oral Daily  . aspirin  81 mg Oral Daily  . citalopram  20 mg Oral Daily  . furosemide  40 mg Oral q1800  . furosemide  80 mg Oral Q breakfast  . insulin aspart  0-10 Units Subcutaneous TID AC  . insulin detemir  40 Units Subcutaneous q morning - 10a  . ketoconazole  1 application Topical Daily  . latanoprost  1 drop Both Eyes QHS  . loratadine  10 mg Oral Daily  . metFORMIN  1,000 mg Oral BID WC  . metoprolol tartrate  25 mg Oral BID  . multivitamin with minerals  1 tablet Oral Daily  . pantoprazole  40 mg Oral Daily  . polyethylene glycol  17 g Oral Daily  . [COMPLETED] polyethylene glycol-electrolytes  4,000 mL Oral Once  . simvastatin  40 mg Oral q1800  . sodium chloride  3 mL Intravenous Q12H  . triamcinolone cream  1 application Topical BID  . zolpidem  10 mg Oral QHS  . [DISCONTINUED] ALPRAZolam  0.5 mg Oral Daily  . [DISCONTINUED] citalopram  20 mg Oral Daily  . [DISCONTINUED] furosemide  40-80 mg Oral BID  . [DISCONTINUED] insulin aspart  10 Units Subcutaneous TID AC  . [DISCONTINUED] insulin aspart  19 Units Subcutaneous TID AC  . [DISCONTINUED] latanoprost  1 drop Both Eyes QHS  . [DISCONTINUED] loratadine  10 mg Oral Daily  . [DISCONTINUED] metFORMIN  1,000 mg Oral BID WC  . [DISCONTINUED] pantoprazole  40 mg Oral BID AC  . [DISCONTINUED] pantoprazole  40 mg Oral Daily  . [DISCONTINUED] simvastatin  40 mg Oral QHS  . [DISCONTINUED] zolpidem  10 mg Oral QHS   Continuous Infusions:   . sodium chloride 1,000 mL  (04/15/12 1302)  . sodium chloride 10 mL/hr at 04/15/12 1410   Assessment/Plan: 1. GI bleed: Ms Hubers was admitted with painless rectal bleed for one day. Etiology was unclear but the history and physical findings were very suggestive of this being from LGIB secondary to internal and external hemorrhoids. A possibility of this being from upper GI bleeding was also considered given her prolonged use of aspirin. However, this was unlikely from the history and physical examination. Other differentials considered included diverticulitis, even though she did not have a history of this. Aspirin was withheld during hospitalization, given this episode of GI bleeding. She continued with her Protonix over 40 mg once daily. Due to her advanced age and lack of a recent colonoscopy, further evaluation with endoscopy was pursued. Her hemoglobin  level was closely monitored and remained stable at around a baseline of 12.5. However, following the endoscopy, her hemoglobin dropped to 9.9, possibly of do to intraprocedure bleeding and IVF. She did not have rectal bleeding at the time of discharge. She remained completely asymptomatic. Her vitals also remained stable. The colonoscopy revealed internal and external hemorrhoids as initially suspected, and multiple polyps were also identified in the multiple areas in the colon and were biopsied for histology. She was discharged on stable condition. I contacted Dr. Modena Jansky before she was discharged. She will followup with him as soon as possible. Her Miralax was increased to bid in order to easy bowel movements. She was instructed to resume once usual dose of once daily once she is happy with the BM. Dr Ewing Schlein will contact her with the results of the biopsy from colonoscopy. 2. DM II. Her last Hgb A1C per [facility] records was 7.4% in June 2013. She is on Levemir 60 units daily, Novolog 18 units TID ac, and metformin 1000mg  BID. The patient was continued on her home medications, including  Levemir 40 units daily   and Novolog 10 units TID ac. The metformin was withheld during her hospital stay.  3. HTN: Her blood pressure remained stable during hospitalization. The Lasix was withheld, but reassured and on discharge at a dose of 40 mg BID, Losartan 100mg  daily, and metoprolol 25mg  BID.  4. Dispo: The patient was discharged in stable condition, and she'll followup with Dr. Modena Jansky who is her primary physician. Her home medication were restarted.     LOS: 2 days   Dow Adolph 04/15/2012, 3:36 PM

## 2012-04-16 ENCOUNTER — Encounter (HOSPITAL_COMMUNITY): Payer: Self-pay | Admitting: Gastroenterology

## 2012-04-17 LAB — URINE CULTURE: Colony Count: >=100000

## 2012-12-08 ENCOUNTER — Other Ambulatory Visit (HOSPITAL_COMMUNITY): Payer: Self-pay | Admitting: *Deleted

## 2012-12-09 ENCOUNTER — Encounter (HOSPITAL_COMMUNITY)
Admission: RE | Admit: 2012-12-09 | Discharge: 2012-12-09 | Disposition: A | Discharge status: Home / Self Care | Payer: No Typology Code available for payment source | Source: Ambulatory Visit | Attending: Family Medicine | Admitting: Family Medicine

## 2012-12-09 DIAGNOSIS — D649 Anemia, unspecified: Secondary | ICD-10-CM | POA: Insufficient documentation

## 2012-12-09 MED ORDER — FERUMOXYTOL INJECTION 510 MG/17 ML
INTRAVENOUS | Status: AC
  Filled 2012-12-09: qty 17

## 2012-12-09 MED ORDER — SODIUM CHLORIDE 0.9 % IV SOLN
INTRAVENOUS | Status: DC
  Administered 2012-12-09: 10:00:00 via INTRAVENOUS

## 2012-12-09 MED ORDER — FERUMOXYTOL INJECTION 510 MG/17 ML
510.0000 mg | INTRAVENOUS | Status: DC
  Administered 2012-12-09: 510 mg via INTRAVENOUS

## 2012-12-22 ENCOUNTER — Other Ambulatory Visit (HOSPITAL_COMMUNITY): Payer: Self-pay | Admitting: *Deleted

## 2012-12-23 ENCOUNTER — Encounter (HOSPITAL_COMMUNITY)
Admission: RE | Admit: 2012-12-23 | Discharge: 2012-12-23 | Disposition: A | Discharge status: Home / Self Care | Payer: Medicare (Managed Care) | Source: Ambulatory Visit | Attending: Family Medicine | Admitting: Family Medicine

## 2012-12-23 DIAGNOSIS — E119 Type 2 diabetes mellitus without complications: Secondary | ICD-10-CM | POA: Insufficient documentation

## 2012-12-23 MED ORDER — SODIUM CHLORIDE 0.9 % IV SOLN: INTRAVENOUS | Status: DC

## 2012-12-23 MED ORDER — FERUMOXYTOL INJECTION 510 MG/17 ML
510.0000 mg | INTRAVENOUS | Status: DC
  Administered 2012-12-23: 510 mg via INTRAVENOUS

## 2012-12-23 MED ORDER — SODIUM CHLORIDE 0.9 % IV SOLN
INTRAVENOUS | Status: DC
  Administered 2012-12-23: 11:00:00 via INTRAVENOUS

## 2012-12-23 MED ORDER — FERUMOXYTOL INJECTION 510 MG/17 ML: 510.0000 mg | INTRAVENOUS | Status: DC

## 2012-12-23 MED ORDER — FERUMOXYTOL INJECTION 510 MG/17 ML
INTRAVENOUS | Status: AC
  Administered 2012-12-23: 510 mg via INTRAVENOUS
  Filled 2012-12-23: qty 17

## 2013-02-03 ENCOUNTER — Other Ambulatory Visit: Payer: Self-pay | Admitting: Family Medicine

## 2013-02-03 DIAGNOSIS — Z1231 Encounter for screening mammogram for malignant neoplasm of breast: Secondary | ICD-10-CM

## 2013-03-03 ENCOUNTER — Ambulatory Visit
Admission: RE | Admit: 2013-03-03 | Discharge: 2013-03-03 | Disposition: A | Discharge status: Home / Self Care | Payer: 59 | Source: Ambulatory Visit | Attending: Family Medicine | Admitting: Family Medicine

## 2013-03-03 DIAGNOSIS — Z1231 Encounter for screening mammogram for malignant neoplasm of breast: Secondary | ICD-10-CM

## 2014-01-30 ENCOUNTER — Other Ambulatory Visit: Payer: Self-pay | Admitting: Family Medicine

## 2014-01-30 DIAGNOSIS — Z1231 Encounter for screening mammogram for malignant neoplasm of breast: Secondary | ICD-10-CM

## 2014-03-07 ENCOUNTER — Ambulatory Visit
Admission: RE | Admit: 2014-03-07 | Discharge: 2014-03-07 | Disposition: A | Discharge status: Home / Self Care | Payer: No Typology Code available for payment source | Source: Ambulatory Visit | Attending: Family Medicine | Admitting: Family Medicine

## 2014-03-07 DIAGNOSIS — Z1231 Encounter for screening mammogram for malignant neoplasm of breast: Secondary | ICD-10-CM

## 2014-05-23 ENCOUNTER — Other Ambulatory Visit: Payer: Self-pay | Admitting: *Deleted

## 2014-05-23 ENCOUNTER — Ambulatory Visit
Admission: RE | Admit: 2014-05-23 | Discharge: 2014-05-23 | Disposition: A | Discharge status: Home / Self Care | Payer: No Typology Code available for payment source | Source: Ambulatory Visit | Attending: *Deleted | Admitting: *Deleted

## 2014-05-23 ENCOUNTER — Other Ambulatory Visit: Payer: No Typology Code available for payment source

## 2014-05-23 DIAGNOSIS — W19XXXA Unspecified fall, initial encounter: Secondary | ICD-10-CM

## 2015-01-05 ENCOUNTER — Other Ambulatory Visit: Payer: Self-pay | Admitting: Family Medicine

## 2015-01-05 DIAGNOSIS — Z1231 Encounter for screening mammogram for malignant neoplasm of breast: Secondary | ICD-10-CM

## 2015-03-13 ENCOUNTER — Ambulatory Visit
Admission: RE | Admit: 2015-03-13 | Discharge: 2015-03-13 | Disposition: A | Discharge status: Home / Self Care | Payer: No Typology Code available for payment source | Source: Ambulatory Visit | Attending: Family Medicine | Admitting: Family Medicine

## 2015-03-13 DIAGNOSIS — Z1231 Encounter for screening mammogram for malignant neoplasm of breast: Secondary | ICD-10-CM

## 2015-11-14 ENCOUNTER — Other Ambulatory Visit: Payer: Self-pay | Admitting: Family Medicine

## 2015-11-14 DIAGNOSIS — Z1231 Encounter for screening mammogram for malignant neoplasm of breast: Secondary | ICD-10-CM

## 2015-11-19 ENCOUNTER — Other Ambulatory Visit (HOSPITAL_COMMUNITY): Payer: Self-pay | Admitting: *Deleted

## 2015-11-20 ENCOUNTER — Ambulatory Visit (HOSPITAL_COMMUNITY)
Admission: RE | Admit: 2015-11-20 | Discharge: 2015-11-20 | Disposition: A | Discharge status: Home / Self Care | Payer: Medicare (Managed Care) | Source: Ambulatory Visit | Attending: Family Medicine | Admitting: Family Medicine

## 2015-11-20 DIAGNOSIS — D500 Iron deficiency anemia secondary to blood loss (chronic): Secondary | ICD-10-CM | POA: Diagnosis not present

## 2015-11-20 MED ORDER — SODIUM CHLORIDE 0.9 % IV SOLN
510.0000 mg | INTRAVENOUS | Status: DC
  Administered 2015-11-20: 510 mg via INTRAVENOUS
  Filled 2015-11-20: qty 17

## 2015-12-04 ENCOUNTER — Encounter (HOSPITAL_COMMUNITY)
Admission: RE | Admit: 2015-12-04 | Discharge: 2015-12-04 | Disposition: A | Discharge status: Home / Self Care | Payer: Medicare (Managed Care) | Source: Ambulatory Visit | Attending: Family Medicine | Admitting: Family Medicine

## 2015-12-04 DIAGNOSIS — D509 Iron deficiency anemia, unspecified: Secondary | ICD-10-CM | POA: Insufficient documentation

## 2015-12-04 DIAGNOSIS — D500 Iron deficiency anemia secondary to blood loss (chronic): Secondary | ICD-10-CM | POA: Diagnosis not present

## 2015-12-04 MED ORDER — SODIUM CHLORIDE 0.9 % IV SOLN
510.0000 mg | INTRAVENOUS | Status: DC
  Administered 2015-12-04: 510 mg via INTRAVENOUS
  Filled 2015-12-04: qty 17

## 2016-03-13 ENCOUNTER — Ambulatory Visit
Admission: RE | Admit: 2016-03-13 | Discharge: 2016-03-13 | Disposition: A | Discharge status: Home / Self Care | Payer: Medicare (Managed Care) | Source: Ambulatory Visit | Attending: Family Medicine | Admitting: Family Medicine

## 2016-03-13 DIAGNOSIS — Z1231 Encounter for screening mammogram for malignant neoplasm of breast: Secondary | ICD-10-CM

## 2016-10-29 ENCOUNTER — Emergency Department (HOSPITAL_COMMUNITY): Payer: Medicare (Managed Care)

## 2016-10-29 ENCOUNTER — Emergency Department (HOSPITAL_COMMUNITY)
Admission: EM | Admit: 2016-10-29 | Discharge: 2016-10-29 | Disposition: A | Discharge status: Home / Self Care | Payer: Medicare (Managed Care) | Attending: Emergency Medicine | Admitting: Emergency Medicine

## 2016-10-29 ENCOUNTER — Encounter (HOSPITAL_COMMUNITY): Payer: Self-pay | Admitting: Cardiology

## 2016-10-29 DIAGNOSIS — S8011XA Contusion of right lower leg, initial encounter: Secondary | ICD-10-CM | POA: Insufficient documentation

## 2016-10-29 DIAGNOSIS — Z794 Long term (current) use of insulin: Secondary | ICD-10-CM | POA: Diagnosis not present

## 2016-10-29 DIAGNOSIS — W19XXXA Unspecified fall, initial encounter: Secondary | ICD-10-CM | POA: Insufficient documentation

## 2016-10-29 DIAGNOSIS — I509 Heart failure, unspecified: Secondary | ICD-10-CM | POA: Insufficient documentation

## 2016-10-29 DIAGNOSIS — Y9301 Activity, walking, marching and hiking: Secondary | ICD-10-CM | POA: Diagnosis not present

## 2016-10-29 DIAGNOSIS — Y92009 Unspecified place in unspecified non-institutional (private) residence as the place of occurrence of the external cause: Secondary | ICD-10-CM | POA: Diagnosis not present

## 2016-10-29 DIAGNOSIS — I110 Hypertensive heart disease with heart failure: Secondary | ICD-10-CM | POA: Insufficient documentation

## 2016-10-29 DIAGNOSIS — E119 Type 2 diabetes mellitus without complications: Secondary | ICD-10-CM | POA: Diagnosis not present

## 2016-10-29 DIAGNOSIS — Z7982 Long term (current) use of aspirin: Secondary | ICD-10-CM | POA: Diagnosis not present

## 2016-10-29 DIAGNOSIS — Y999 Unspecified external cause status: Secondary | ICD-10-CM | POA: Insufficient documentation

## 2016-10-29 DIAGNOSIS — S8010XA Contusion of unspecified lower leg, initial encounter: Secondary | ICD-10-CM

## 2016-10-29 DIAGNOSIS — Z79899 Other long term (current) drug therapy: Secondary | ICD-10-CM | POA: Insufficient documentation

## 2016-10-29 DIAGNOSIS — S8991XA Unspecified injury of right lower leg, initial encounter: Secondary | ICD-10-CM | POA: Diagnosis present

## 2016-10-29 LAB — CBC
HCT: 35.9 % — ABNORMAL LOW (ref 36.0–46.0)
Hemoglobin: 11.4 g/dL — ABNORMAL LOW (ref 12.0–15.0)
MCH: 26 pg (ref 26.0–34.0)
MCHC: 31.8 g/dL (ref 30.0–36.0)
MCV: 81.8 fL (ref 78.0–100.0)
PLATELETS: 125 10*3/uL — AB (ref 150–400)
RBC: 4.39 MIL/uL (ref 3.87–5.11)
RDW: 14.7 % (ref 11.5–15.5)
WBC: 7.5 10*3/uL (ref 4.0–10.5)

## 2016-10-29 LAB — BASIC METABOLIC PANEL
ANION GAP: 9 (ref 5–15)
BUN: 22 mg/dL — ABNORMAL HIGH (ref 6–20)
CO2: 30 mmol/L (ref 22–32)
Calcium: 9.1 mg/dL (ref 8.9–10.3)
Chloride: 97 mmol/L — ABNORMAL LOW (ref 101–111)
Creatinine, Ser: 0.95 mg/dL (ref 0.44–1.00)
GFR calc Af Amer: >60 mL/min (ref >60)
GFR, EST NON AFRICAN AMERICAN: 60 mL/min — AB (ref >60)
Glucose, Bld: 234 mg/dL — ABNORMAL HIGH (ref 65–99)
POTASSIUM: 4.2 mmol/L (ref 3.5–5.1)
SODIUM: 136 mmol/L (ref 135–145)

## 2016-10-29 MED ORDER — TRAMADOL HCL 50 MG PO TABS: 50.0000 mg | ORAL_TABLET | Freq: Two times a day (BID) | ORAL | 0 refills | Status: DC | PRN

## 2016-10-29 MED ORDER — MORPHINE SULFATE (PF) 4 MG/ML IV SOLN
4.0000 mg | Freq: Once | INTRAVENOUS | Status: AC
  Administered 2016-10-29: 4 mg via INTRAVENOUS
  Filled 2016-10-29: qty 1

## 2016-10-29 NOTE — ED Provider Notes (Signed)
[facility] DEPT Provider Note   CSN: [id number] Arrival date & time: 10/29/16  1124     History   Chief Complaint Chief Complaint  Patient presents with  . Fall    HPI Kelsey Williams is a 71 y.o. female.  HPI Pt states she thought someone was in her house last night.  She went to check on it and as she was walking with her walker she turned lost her balance and fell.  She is now having pain in bother of her knees and has trouble walking.  She could not get up on her own.  She called EMS.   They helped her get up.  [facility] am and was told to get checked in the ED.  It hurts to walk and she has trouble putting weight on her knees because they hurt.  She denies weakness.  No fevers, occsnl chronic cough but nothing new.  No shortness of breath. Past Medical History:  Diagnosis Date  . Aortic sclerosis   . CHF (congestive heart failure) ([place])   . Depression   . Diabetes mellitus without complication ([place])   . GERD (gastroesophageal reflux disease)   . H/O non-ST elevation myocardial infarction (NSTEMI)   . Hemorrhoids   . Hx of colonic polyps   . Hyperlipidemia   . Hypertension   . Hypertension   . Neurogenic bladder     Patient Active Problem List   Diagnosis Date Noted  . Internal and external bleeding hemorrhoids 04/13/2012  . CHF (congestive heart failure) ([place]) 04/13/2012  . GERD (gastroesophageal reflux disease) 04/13/2012  . Peripheral neuropathy 04/13/2012  . Aortic sclerosis 04/13/2012  . Hyperlipidemia 04/13/2012  . Neurogenic bladder 04/13/2012  . History of colon polyps 04/13/2012  . Depression with anxiety 04/13/2012  . Peripheral arterial disease ([place]) 04/13/2012  . Proliferative diabetic retinopathy ([place]) 04/13/2012  . Paroxysmal atrial fibrillation ([place]) 04/13/2012  . DIABETES MELLITUS, TYPE II 10/21/2006  . GOUT NOS 10/21/2006  . SYSTOLIC HYPERTENSION 08/65/7846  . FIBRILLATION, ATRIAL 10/21/2006  . HYSTERECTOMY,  VAGINAL, HX OF 10/21/2006    Past Surgical History:  Procedure Laterality Date  . COLONOSCOPY  04/15/2012   Procedure: COLONOSCOPY;  Surgeon: Jeryl Columbia, MD;  Location: [facility] END[facility];  Service: Endoscopy;  Laterality: N/A;  . Hysterectomy    . LAPAROSCOPIC CHOLECYSTECTOMY    . sacral decubitis debri    . SACRAL DECUBITUS ULCER EXCISION    . SUPRAPUBIC CATHETER PLACEMENT      OB History    No data available       Home Medications    Prior to Admission medications   Medication Sig Start Date End Date Taking? Authorizing Provider  acetaminophen (TYLENOL) 500 MG tablet Take 500 mg by mouth 2 (two) times daily.   Yes [provider]  ALPRAZolam Duanne Moron) 0.5 MG tablet Take 0.5 mg by mouth daily.   Yes [provider]  aspirin EC 81 MG tablet Take 81 mg by mouth daily.   Yes [provider]  cetirizine (ZYRTEC) 10 MG tablet Take 10 mg by mouth daily.   Yes [provider]  citalopram (CELEXA) 20 MG tablet Take 20 mg by mouth daily.   Yes [provider]  furosemide (LASIX) 40 MG tablet Take 1 tablet (40 mg total) by mouth 2 (two) times daily. Take 2 tabs in the morning and 1 tab in the evening 04/15/12  Yes Jessee Avers, MD  insulin aspart (NOVOLOG) 100  UNIT/ML injection Inject 19 Units into the skin 3 (three) times daily before meals.   Yes [provider]  insulin detemir (LEVEMIR) 100 UNIT/ML injection Inject 60 Units into the skin every morning.   Yes [provider]  ketoconazole (NIZORAL) 2 % shampoo Apply 1 application topically daily. Shampoo scalp and use on face and ear canals   Yes [provider]  latanoprost (XALATAN) 0.005 % ophthalmic solution Place 1 drop into both eyes at bedtime.   Yes [provider]  losartan (COZAAR) 100 MG tablet Take 100 mg by mouth daily.   Yes [provider]  Menthol, Topical Analgesic, (BIOFREEZE ROLL-ON) 4 % GEL Apply 1 application topically 4 (four)  times daily as needed. For pain   Yes [provider]  metFORMIN (GLUCOPHAGE) 1000 MG tablet Take 1,000 mg by mouth 2 (two) times daily with a meal.   Yes [provider]  metoprolol tartrate (LOPRESSOR) 25 MG tablet Take 25 mg by mouth 2 (two) times daily.   Yes [provider]  miconazole (MICRO GUARD) 2 % powder Apply 1 application topically as needed. Apply under folds of skin for rash   Yes [provider]  Multiple Vitamin (MULTIVITAMIN WITH MINERALS) TABS Take 1 tablet by mouth daily.   Yes [provider]  pantoprazole (PROTONIX) 40 MG tablet Take 40 mg by mouth daily.   Yes [provider]  polyethylene glycol (MIRALAX / GLYCOLAX) packet Take 17 g by mouth 2 (two) times daily. 04/15/12  Yes Jessee Avers, MD  silver nitrate applicators 16-10 % applicator Apply 1 application topically as needed. Apply to granulation tissue around stoma of supra pubic cath as needed   Yes [provider]  simvastatin (ZOCOR) 40 MG tablet Take 40 mg by mouth at bedtime.   Yes [provider]  triamcinolone cream (KENALOG) 0.1 % Apply 1 application topically 2 (two) times daily.   Yes [provider]  zolpidem (AMBIEN) 10 MG tablet Take 10 mg by mouth at bedtime.   Yes [provider]  benzonatate (TESSALON) 200 MG capsule Take 200 mg by mouth 3 (three) times daily as needed. For cough    [provider]  loratadine (CLARITIN) 10 MG tablet Take 10 mg by mouth daily.    [provider]  traMADol (ULTRAM) 50 MG tablet Take 1 tablet (50 mg total) by mouth every 12 (twelve) hours as needed. 10/29/16   Dorie Rank, MD  Wound Dressings (CARRASYN HYDROGEL WOUND DRESS EX) Apply 1 application topically 3 (three) times daily.    [provider]    Family History Family History  Problem Relation Age of Onset  . Lung cancer Brother        smoker    Social History Social History  Substance Use Topics   . Smoking status: Never Smoker  . Smokeless tobacco: Never Used  . Alcohol use No     Allergies   Ace inhibitors and Other   Review of Systems Review of Systems  Constitutional: Negative for fever.  Gastrointestinal: Negative for vomiting.  Neurological: Negative for headaches.  All other systems reviewed and are negative.    Physical Exam Updated Vital Signs BP (!) 128/54   Pulse 69   Temp 97.4 F (36.3 C) (Oral)   Resp 16   Ht 1.562 m (5' 1.5")   Wt 78 kg (172 lb)   SpO2 90%   BMI 31.97 kg/m   Physical Exam  Constitutional: She appears  well-developed and well-nourished. No distress.  HENT:  Head: Normocephalic and atraumatic.  Right Ear: External ear normal.  Left Ear: External ear normal.  Eyes: Conjunctivae are normal. Right eye exhibits no discharge. Left eye exhibits no discharge. No scleral icterus.  Neck: Neck supple. No tracheal deviation present.  Cardiovascular: Normal rate, regular rhythm and intact distal pulses.   Murmur (systolic) heard. Pulmonary/Chest: Effort normal and breath sounds normal. No stridor. No respiratory distress. She has no wheezes. She has no rales.  Abdominal: Soft. Bowel sounds are normal. She exhibits no distension. There is no tenderness. There is no rebound and no guarding.  Genitourinary:  Genitourinary Comments: Indwelling catheter   Musculoskeletal: She exhibits edema and tenderness.  Edema and ttp lateral aspect right calf, able to flex and extend the foot, normal sensation, mild abrasion anterior aspect of right shin; mild ttp right hip, no spinal ttp, mild ttp bilateral knees without edema or bruising  Neurological: She is alert. She has normal strength. No cranial nerve deficit (no facial droop, extraocular movements intact, no slurred speech) or sensory deficit. She exhibits normal muscle tone. She displays no seizure activity. Coordination normal.  Skin: Skin is warm and dry. No rash noted.  Psychiatric: She has a  normal mood and affect.  Nursing note and vitals reviewed.    ED Treatments / Results  Labs (all labs ordered are listed, but only abnormal results are displayed) Labs Reviewed  CBC - Abnormal; Notable for the following:       Result Value   Hemoglobin 11.4 (*)    HCT 35.9 (*)    Platelets 125 (*)    All other components within normal limits  BASIC METABOLIC PANEL - Abnormal; Notable for the following:    Chloride 97 (*)    Glucose, Bld 234 (*)    BUN 22 (*)    GFR calc non Af Amer 60 (*)    All other components within normal limits   Radiology Dg Tibia/fibula Left  Result Date: 10/29/2016 CLINICAL DATA:  Left lower leg pain due to a fall last night. Initial encounter. EXAM: LEFT TIBIA AND FIBULA - 2 VIEW COMPARISON:  None. FINDINGS: There is no evidence of fracture or other focal bone lesions. Small knee joint effusion is noted. Mild patellofemoral degenerative disease is seen. Atherosclerosis is noted. IMPRESSION: Negative for fracture. Small knee joint effusion. Atherosclerosis. Electronically Signed   By: Inge Rise M.D.   On: 10/29/2016 14:21   Dg Tibia/fibula Right  Result Date: 10/29/2016 CLINICAL DATA:  Right lower leg pain due to a fall last night. Initial encounter. EXAM: RIGHT TIBIA AND FIBULA - 2 VIEW COMPARISON:  None. FINDINGS: No acute bony or joint abnormality is identified. Small knee joint effusion is noted. No focal bony lesion. Atherosclerosis is seen. IMPRESSION: Negative for fracture. Small knee joint effusion. Atherosclerosis. Electronically Signed   By: Inge Rise M.D.   On: 10/29/2016 14:20   Dg Knee Complete 4 Views Right  Result Date: 10/29/2016 CLINICAL DATA:  Right knee pain since a fall last night. Initial encounter. EXAM: RIGHT KNEE - COMPLETE 4+ VIEW COMPARISON:  None. FINDINGS: There is no acute bony or joint abnormality. Small joint effusion is noted. Atherosclerosis is identified. IMPRESSION: No acute abnormality. Small joint effusion.  Atherosclerosis. Electronically Signed   By: Inge Rise M.D.   On: 10/29/2016 14:22   Dg Hip Unilat With Pelvis 2-3 Views Right  Result Date: 10/29/2016 CLINICAL DATA:  Right hip pain due to a  fall last night. Initial encounter. EXAM: DG HIP (WITH OR WITHOUT PELVIS) 2-3V RIGHT COMPARISON:  Plain films right hip 05/23/2014. FINDINGS: There is no evidence of hip fracture or dislocation. There is no evidence of arthropathy or other focal bone abnormality. Atherosclerosis is noted. IMPRESSION: No acute abnormality. Atherosclerosis. Electronically Signed   By: Inge Rise M.D.   On: 10/29/2016 14:23    Procedures Procedures (including critical care time)  Medications Ordered in ED Medications  morphine 4 MG/ML injection 4 mg (4 mg Intravenous Given 10/29/16 1304)     Initial Impression / Assessment and Plan / ED Course  I have reviewed the triage vital signs and the nursing notes.  Pertinent labs & imaging results that were available during my care of the patient were reviewed by me and considered in my medical decision making (see chart for details).   patient presented to the emergency room with pain after fall.  X-rays do not show any evidence of fracture or dislocation. She does have some fluid in the joint that I suspect is related to her fall.  We'll discharge home with pain medications. Discussed ice to help with the pain and swelling. Follow-up with her primary doctor  Final Clinical Impressions(s) / ED Diagnoses   Final diagnoses:  Contusion of multiple sites of lower extremity, unspecified laterality, initial encounter    New Prescriptions New Prescriptions   TRAMADOL (ULTRAM) 50 MG TABLET    Take 1 tablet (50 mg total) by mouth every 12 (twelve) hours as needed.     Dorie Rank, MD 10/29/16 [phone]

## 2016-10-29 NOTE — Discharge Instructions (Signed)
Apply ice to help with the swelling, take the medications as needed for pain, follow-up with your primary care doctor
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
## 2016-11-03 ENCOUNTER — Other Ambulatory Visit: Payer: Self-pay | Admitting: Family Medicine

## 2016-11-03 DIAGNOSIS — Z1231 Encounter for screening mammogram for malignant neoplasm of breast: Secondary | ICD-10-CM

## 2016-11-24 ENCOUNTER — Other Ambulatory Visit (HOSPITAL_COMMUNITY): Payer: Self-pay | Admitting: *Deleted

## 2016-11-25 ENCOUNTER — Ambulatory Visit (HOSPITAL_COMMUNITY)
Admission: RE | Admit: 2016-11-25 | Discharge: 2016-11-25 | Disposition: A | Discharge status: Home / Self Care | Payer: Medicare (Managed Care) | Source: Ambulatory Visit | Attending: Family Medicine | Admitting: Family Medicine

## 2016-11-25 DIAGNOSIS — D500 Iron deficiency anemia secondary to blood loss (chronic): Secondary | ICD-10-CM | POA: Insufficient documentation

## 2016-11-25 MED ORDER — SODIUM CHLORIDE 0.9 % IV SOLN
510.0000 mg | INTRAVENOUS | Status: DC
  Administered 2016-11-25: 11:00:00 510 mg via INTRAVENOUS
  Filled 2016-11-25: qty 17

## 2016-11-25 NOTE — Discharge Instructions (Signed)

## 2016-12-16 ENCOUNTER — Ambulatory Visit (HOSPITAL_COMMUNITY)
Admission: RE | Admit: 2016-12-16 | Discharge: 2016-12-16 | Disposition: A | Discharge status: Home / Self Care | Payer: Medicare (Managed Care) | Source: Ambulatory Visit | Attending: Family Medicine | Admitting: Family Medicine

## 2016-12-16 DIAGNOSIS — D509 Iron deficiency anemia, unspecified: Secondary | ICD-10-CM | POA: Diagnosis not present

## 2016-12-16 MED ORDER — SODIUM CHLORIDE 0.9 % IV SOLN
510.0000 mg | INTRAVENOUS | Status: DC
  Administered 2016-12-16: 11:00:00 510 mg via INTRAVENOUS
  Filled 2016-12-16: qty 17

## 2017-02-24 ENCOUNTER — Other Ambulatory Visit: Payer: Self-pay | Admitting: Nurse Practitioner

## 2017-02-24 ENCOUNTER — Ambulatory Visit
Admission: RE | Admit: 2017-02-24 | Discharge: 2017-02-24 | Disposition: A | Discharge status: Home / Self Care | Payer: Medicare (Managed Care) | Source: Ambulatory Visit | Attending: Nurse Practitioner | Admitting: Nurse Practitioner

## 2017-02-24 DIAGNOSIS — R52 Pain, unspecified: Secondary | ICD-10-CM

## 2017-02-26 ENCOUNTER — Ambulatory Visit (HOSPITAL_COMMUNITY)
Admission: EM | Admit: 2017-02-26 | Discharge: 2017-02-26 | Disposition: A | Discharge status: Home / Self Care | Payer: Medicare (Managed Care) | Attending: Emergency Medicine | Admitting: Emergency Medicine

## 2017-02-26 ENCOUNTER — Encounter (HOSPITAL_COMMUNITY): Payer: Self-pay | Admitting: *Deleted

## 2017-02-26 DIAGNOSIS — S81812A Laceration without foreign body, left lower leg, initial encounter: Secondary | ICD-10-CM

## 2017-02-26 HISTORY — DX: Type 2 diabetes mellitus with unspecified diabetic retinopathy without macular edema: E11.319

## 2017-02-26 HISTORY — DX: Disorder of kidney and ureter, unspecified: N28.9

## 2017-02-26 HISTORY — DX: Peripheral vascular disease, unspecified: I73.9

## 2017-02-26 HISTORY — DX: Type 2 diabetes mellitus with diabetic neuropathy, unspecified: E11.40

## 2017-02-26 MED ORDER — LIDOCAINE-EPINEPHRINE (PF) 2 %-1:200000 IJ SOLN
INTRAMUSCULAR | Status: AC
  Filled 2017-02-26: qty 20

## 2017-02-26 MED ORDER — AMOXICILLIN-POT CLAVULANATE 500-125 MG PO TABS: 1.0000 | ORAL_TABLET | Freq: Two times a day (BID) | ORAL | 0 refills | Status: DC
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
## 2017-02-26 NOTE — Discharge Instructions (Signed)
Keep dressing applied today for 2 days. Do not wet wound. Keep dry and clean. Take dressing off after 2 days and Gently wash with soap and water.  No ointment to the wound site. Keep covered when stepping out of house. Take antibiotic as prescribed. If any redness, drainage , swelling noted return for re evaluation. Suture removal x 7-10 days.

## 2017-02-26 NOTE — ED Provider Notes (Addendum)
[place]    CSN: [id number] Arrival date & time: 02/26/17  1433     History   Chief Complaint Chief Complaint  Patient presents with  . Extremity Laceration    HPI Kelsey Williams is a 71 y.o. female from day care presented with CNA with laceration to left lower leg. Pt states sustained laceration when a resident accidentally hit left leg with wheel chair. Dressing present when pt arrived. About 10 cm spiral laceration noted to medial/posterior aspect of left lower leg. Bleeding present. Tender to palpation at and around laceration site. Mild swelling noted. Pt UTD with Td.   The history is provided by the patient.    Past Medical History:  Diagnosis Date  . Aortic sclerosis   . CHF (congestive heart failure) ([place])   . Depression   . Diabetes mellitus without complication ([place])   . Diabetic neuropathy ([place])   . Diabetic retinopathy ([place])   . GERD (gastroesophageal reflux disease)   . H/O non-ST elevation myocardial infarction (NSTEMI)   . Hemorrhoids   . Hx of colonic polyps   . Hyperlipidemia   . Hypertension   . Hypertension   . Neurogenic bladder   . Neurogenic bladder   . PAD (peripheral artery disease) ([place])   . Renal disorder     Patient Active Problem List   Diagnosis Date Noted  . Internal and external bleeding hemorrhoids 04/13/2012  . CHF (congestive heart failure) ([place]) 04/13/2012  . GERD (gastroesophageal reflux disease) 04/13/2012  . Peripheral neuropathy 04/13/2012  . Aortic sclerosis 04/13/2012  . Hyperlipidemia 04/13/2012  . Neurogenic bladder 04/13/2012  . History of colon polyps 04/13/2012  . Depression with anxiety 04/13/2012  . Peripheral arterial disease ([place]) 04/13/2012  . Proliferative diabetic retinopathy ([place]) 04/13/2012  . Paroxysmal atrial fibrillation ([place]) 04/13/2012  . DIABETES MELLITUS, TYPE II 10/21/2006  . GOUT NOS 10/21/2006  . SYSTOLIC HYPERTENSION 28/31/5176  . FIBRILLATION, ATRIAL 10/21/2006  .  HYSTERECTOMY, VAGINAL, HX OF 10/21/2006    Past Surgical History:  Procedure Laterality Date  . COLONOSCOPY  04/15/2012   Procedure: COLONOSCOPY;  Surgeon: Jeryl Columbia, MD;  Location: [facility] ENDOSCOPY;  Service: Endoscopy;  Laterality: N/A;  . Hysterectomy    . LAPAROSCOPIC CHOLECYSTECTOMY    . sacral decubitis debri    . SACRAL DECUBITUS ULCER EXCISION    . SUPRAPUBIC CATHETER PLACEMENT      OB History    No data available       Home Medications    Prior to Admission medications   Medication Sig Start Date End Date Taking? Authorizing Provider  acetaminophen (TYLENOL) 500 MG tablet Take 500 mg by mouth 2 (two) times daily.    [provider]  ALPRAZolam Duanne Moron) 0.5 MG tablet Take 0.5 mg by mouth daily.    [provider]  amoxicillin-clavulanate (AUGMENTIN) 500-125 MG tablet Take 1 tablet (500 mg total) by mouth 2 (two) times daily. 02/26/17   Wilford Merryfield, NP  aspirin EC 81 MG tablet Take 81 mg by mouth daily.    [provider]  benzonatate (TESSALON) 200 MG capsule Take 200 mg by mouth 3 (three) times daily as needed. For cough    [provider]  cetirizine (ZYRTEC) 10 MG tablet Take 10 mg by mouth daily.    [provider]  citalopram (CELEXA) 20 MG tablet Take 20 mg by mouth daily.    [provider]  furosemide (LASIX) 40 MG tablet Take 1 tablet (40 mg  total) by mouth 2 (two) times daily. Take 2 tabs in the morning and 1 tab in the evening 04/15/12   Jessee Avers, MD  insulin aspart (NOVOLOG) 100 UNIT/ML injection Inject 19 Units into the skin 3 (three) times daily before meals.    [provider]  insulin detemir (LEVEMIR) 100 UNIT/ML injection Inject 60 Units into the skin every morning.    [provider]  ketoconazole (NIZORAL) 2 % shampoo Apply 1 application topically daily. Shampoo scalp and use on face and ear canals    [provider]  latanoprost (XALATAN) 0.005 % ophthalmic  solution Place 1 drop into both eyes at bedtime.    [provider]  loratadine (CLARITIN) 10 MG tablet Take 10 mg by mouth daily.    [provider]  losartan (COZAAR) 100 MG tablet Take 100 mg by mouth daily.    [provider]  Menthol, Topical Analgesic, (BIOFREEZE ROLL-ON) 4 % GEL Apply 1 application topically 4 (four) times daily as needed. For pain    [provider]  metFORMIN (GLUCOPHAGE) 1000 MG tablet Take 1,000 mg by mouth 2 (two) times daily with a meal.    [provider]  metoprolol tartrate (LOPRESSOR) 25 MG tablet Take 25 mg by mouth 2 (two) times daily.    [provider]  miconazole (MICRO GUARD) 2 % powder Apply 1 application topically as needed. Apply under folds of skin for rash    [provider]  Multiple Vitamin (MULTIVITAMIN WITH MINERALS) TABS Take 1 tablet by mouth daily.    [provider]  pantoprazole (PROTONIX) 40 MG tablet Take 40 mg by mouth daily.    [provider]  polyethylene glycol (MIRALAX / GLYCOLAX) packet Take 17 g by mouth 2 (two) times daily. 04/15/12   Jessee Avers, MD  silver nitrate applicators 75-10 % applicator Apply 1 application topically as needed. Apply to granulation tissue around stoma of supra pubic cath as needed    [provider]  simvastatin (ZOCOR) 40 MG tablet Take 40 mg by mouth at bedtime.    [provider]  traMADol (ULTRAM) 50 MG tablet Take 1 tablet (50 mg total) by mouth every 12 (twelve) hours as needed. 10/29/16   Dorie Rank, MD  triamcinolone cream (KENALOG) 0.1 % Apply 1 application topically 2 (two) times daily.    [provider]  Wound Dressings (CARRASYN HYDROGEL WOUND DRESS EX) Apply 1 application topically 3 (three) times daily.    [provider]  zolpidem (AMBIEN) 10 MG tablet Take 10 mg by mouth at bedtime.    [provider]    Family History Family History  Problem Relation Age of Onset   . Lung cancer Brother        smoker    Social History Social History  Substance Use Topics  . Smoking status: Never Smoker  . Smokeless tobacco: Never Used  . Alcohol use No     Allergies   Ace inhibitors and Other   Review of Systems Review of Systems  Constitutional: Negative.   HENT: Negative.   Respiratory: Negative.   Cardiovascular: Negative.   Skin: Positive for wound (laceration to left lower leg).  Neurological: Negative.   Psychiatric/Behavioral: Negative.      Physical Exam Triage Vital Signs ED Triage Vitals  Enc Vitals Group     BP      Pulse      Resp      Temp  Temp src      SpO2      Weight      Height      Head Circumference      Peak Flow      Pain Score      Pain Loc      Pain Edu?      Excl. in [place]?    No data found.   Updated Vital Signs There were no vitals taken for this visit.  Visual Acuity Right Eye Distance:   Left Eye Distance:   Bilateral Distance:    Right Eye Near:   Left Eye Near:    Bilateral Near:     Physical Exam  Constitutional: She is oriented to person, place, and time. She appears well-developed and well-nourished.  HENT:  Head: Normocephalic.  Cardiovascular: Normal rate and regular rhythm.   Pulmonary/Chest: Effort normal and breath sounds normal.  Musculoskeletal: Normal range of motion.  Uses walker to walk. Gait steady.  Neurological: She is alert and oriented to person, place, and time.  Skin: Skin is warm.     About 10 cm spiral , gaping laceration to left lower leg. Bleeding present.     UC Treatments / Results  Labs (all labs ordered are listed, but only abnormal results are displayed) Labs Reviewed - No data to display  EKG  EKG Interpretation None       Radiology No results found.  Procedures .Marland KitchenLaceration Repair Date/Time: 02/26/2017 5:18 PM Performed by: Teola Bradley Authorized by: Melynda Ripple   Consent:    Consent obtained:  Verbal   Consent given  by:  Patient   Risks discussed:  Infection, pain, need for additional repair, poor cosmetic result, tendon damage, poor wound healing and vascular damage Anesthesia (see MAR for exact dosages):    Anesthesia method:  Local infiltration   Local anesthetic:  Lidocaine 2% WITH epi (74ml's) Laceration details:    Location:  Leg   Leg location:  L lower leg   Length (cm):  10 Repair type:    Repair type:  Intermediate Pre-procedure details:    Preparation:  Patient was prepped and draped in usual sterile fashion Exploration:    Hemostasis achieved with:  Direct pressure   Wound exploration: wound explored through full range of motion     Contaminated: no   Treatment:    Area cleansed with:  Betadine (scrubbed with hydrogen peroxide, then irrigated with sterile water+ Betadine)   Amount of cleaning:  Extensive   Irrigation solution:  Sterile water   Irrigation volume:  30 Ml   Irrigation method:  Syringe   Foreign body removal: N/A.   Skin repair:    Repair method:  Sutures   Suture size:  4-0   Suture material:  Nylon   Suture technique:  Horizontal mattress   Number of sutures:  10 Approximation:    Approximation:  Close   [place] border: well-aligned   Post-procedure details:    Dressing:  Non-adherent dressing, sterile dressing and bulky dressing ([place]ix)   Patient tolerance of procedure:  Tolerated well, no immediate complications   (including critical care time)  Medications Ordered in UC Medications - No data to display   Initial Impression / Assessment and Plan / UC Course  I have reviewed the triage vital signs and the nursing notes.  Pertinent labs & imaging results that were available during my care of the patient were reviewed by me and considered in my medical decision making (see chart  for details).    About 10 cm spiral laceration to lateral/posterior aspect of left lower leg. 10 Mattress sutures applied. Pt UTD with Td. Sutures removal x 10 days.   Final  Clinical Impressions(s) / UC Diagnoses   Final diagnoses:  Leg laceration, left, initial encounter    New Prescriptions New Prescriptions   AMOXICILLIN-CLAVULANATE (AUGMENTIN) 500-125 MG TABLET    Take 1 tablet (500 mg total) by mouth 2 (two) times daily.     Controlled Substance Prescriptions [place] Controlled Substance Registry consulted? Not Applicable   Teola Bradley, NP 02/26/17 [place], [place], NP 02/26/17 1730    Teola Bradley, NP 02/26/17 1742

## 2017-03-17 ENCOUNTER — Ambulatory Visit
Admission: RE | Admit: 2017-03-17 | Discharge: 2017-03-17 | Disposition: A | Discharge status: Home / Self Care | Payer: Medicare (Managed Care) | Source: Ambulatory Visit | Attending: Family Medicine | Admitting: Family Medicine

## 2017-03-17 DIAGNOSIS — Z1231 Encounter for screening mammogram for malignant neoplasm of breast: Secondary | ICD-10-CM

## 2017-03-18 ENCOUNTER — Other Ambulatory Visit: Payer: Self-pay | Admitting: Family Medicine

## 2017-03-18 DIAGNOSIS — Z1231 Encounter for screening mammogram for malignant neoplasm of breast: Secondary | ICD-10-CM

## 2017-11-20 ENCOUNTER — Ambulatory Visit (HOSPITAL_COMMUNITY)
Admission: RE | Admit: 2017-11-20 | Discharge: 2017-11-20 | Disposition: A | Discharge status: Home / Self Care | Payer: Medicare (Managed Care) | Source: Ambulatory Visit | Attending: Family Medicine | Admitting: Family Medicine

## 2017-11-20 ENCOUNTER — Ambulatory Visit (HOSPITAL_BASED_OUTPATIENT_CLINIC_OR_DEPARTMENT_OTHER)
Admission: RE | Admit: 2017-11-20 | Discharge: 2017-11-20 | Disposition: A | Discharge status: Home / Self Care | Payer: Medicare (Managed Care) | Source: Ambulatory Visit

## 2017-11-20 ENCOUNTER — Other Ambulatory Visit (HOSPITAL_COMMUNITY): Payer: Self-pay | Admitting: Family Medicine

## 2017-11-20 ENCOUNTER — Encounter (HOSPITAL_COMMUNITY): Payer: Self-pay

## 2017-11-20 DIAGNOSIS — I739 Peripheral vascular disease, unspecified: Secondary | ICD-10-CM

## 2017-11-20 DIAGNOSIS — I252 Old myocardial infarction: Secondary | ICD-10-CM | POA: Diagnosis not present

## 2017-11-20 DIAGNOSIS — E119 Type 2 diabetes mellitus without complications: Secondary | ICD-10-CM | POA: Insufficient documentation

## 2017-11-20 DIAGNOSIS — I5020 Unspecified systolic (congestive) heart failure: Secondary | ICD-10-CM | POA: Diagnosis not present

## 2017-11-20 DIAGNOSIS — I2510 Atherosclerotic heart disease of native coronary artery without angina pectoris: Secondary | ICD-10-CM | POA: Diagnosis not present

## 2017-11-20 NOTE — Progress Notes (Signed)
VASCULAR LAB PRELIMINARY  ARTERIAL  ABI completed:    RIGHT    LEFT    PRESSURE WAVEFORM  PRESSURE WAVEFORM  BRACHIAL 136 Triphasic BRACHIAL 141 Triphasic  DP 107 Triphasic DP 123 Triphasic  PT 136 Biphasic PT 115 Biphasic  GREAT TOE 130 NA GREAT TOE 135 NA    RIGHT LEFT  ABI / TBI 0.96/0.92 0.87/0.96   ABI on the right indicates normal arterial flow at rest. TBI is normal. ABI on the left indicates a mild reduction in arterial flow at rest. TBI is normal  Dyasia Firestine, RVS 11/20/2017, 2:07 PM

## 2018-03-18 ENCOUNTER — Ambulatory Visit
Admission: RE | Admit: 2018-03-18 | Discharge: 2018-03-18 | Disposition: A | Discharge status: Home / Self Care | Payer: Medicare (Managed Care) | Source: Ambulatory Visit | Attending: Family Medicine | Admitting: Family Medicine

## 2018-03-18 DIAGNOSIS — Z1231 Encounter for screening mammogram for malignant neoplasm of breast: Secondary | ICD-10-CM

## 2019-01-27 ENCOUNTER — Inpatient Hospital Stay (HOSPITAL_COMMUNITY)
Admission: EM | Admit: 2019-01-27 | Discharge: 2019-02-10 | DRG: 250 | Disposition: E | Payer: Medicare (Managed Care) | Attending: Pulmonary Disease | Admitting: Pulmonary Disease

## 2019-01-27 ENCOUNTER — Emergency Department (HOSPITAL_COMMUNITY): Payer: Medicare (Managed Care)

## 2019-01-27 ENCOUNTER — Encounter (HOSPITAL_COMMUNITY): Payer: Self-pay | Admitting: Emergency Medicine

## 2019-01-27 ENCOUNTER — Inpatient Hospital Stay (HOSPITAL_COMMUNITY): Admission: EM | Disposition: E | Payer: Self-pay | Source: Home / Self Care | Attending: Pulmonary Disease

## 2019-01-27 ENCOUNTER — Other Ambulatory Visit: Payer: Self-pay

## 2019-01-27 DIAGNOSIS — Z9049 Acquired absence of other specified parts of digestive tract: Secondary | ICD-10-CM

## 2019-01-27 DIAGNOSIS — Z7982 Long term (current) use of aspirin: Secondary | ICD-10-CM

## 2019-01-27 DIAGNOSIS — Z86010 Personal history of colonic polyps: Secondary | ICD-10-CM

## 2019-01-27 DIAGNOSIS — I4891 Unspecified atrial fibrillation: Secondary | ICD-10-CM | POA: Diagnosis present

## 2019-01-27 DIAGNOSIS — I110 Hypertensive heart disease with heart failure: Secondary | ICD-10-CM | POA: Diagnosis present

## 2019-01-27 DIAGNOSIS — E872 Acidosis: Secondary | ICD-10-CM | POA: Diagnosis present

## 2019-01-27 DIAGNOSIS — Z801 Family history of malignant neoplasm of trachea, bronchus and lung: Secondary | ICD-10-CM

## 2019-01-27 DIAGNOSIS — I469 Cardiac arrest, cause unspecified: Secondary | ICD-10-CM | POA: Diagnosis present

## 2019-01-27 DIAGNOSIS — I25110 Atherosclerotic heart disease of native coronary artery with unstable angina pectoris: Secondary | ICD-10-CM | POA: Diagnosis present

## 2019-01-27 DIAGNOSIS — I5031 Acute diastolic (congestive) heart failure: Secondary | ICD-10-CM | POA: Diagnosis present

## 2019-01-27 DIAGNOSIS — I2584 Coronary atherosclerosis due to calcified coronary lesion: Secondary | ICD-10-CM | POA: Diagnosis present

## 2019-01-27 DIAGNOSIS — T508X5A Adverse effect of diagnostic agents, initial encounter: Secondary | ICD-10-CM | POA: Diagnosis not present

## 2019-01-27 DIAGNOSIS — I2109 ST elevation (STEMI) myocardial infarction involving other coronary artery of anterior wall: Secondary | ICD-10-CM | POA: Diagnosis not present

## 2019-01-27 DIAGNOSIS — Z515 Encounter for palliative care: Secondary | ICD-10-CM | POA: Diagnosis not present

## 2019-01-27 DIAGNOSIS — Z66 Do not resuscitate: Secondary | ICD-10-CM | POA: Diagnosis not present

## 2019-01-27 DIAGNOSIS — Z452 Encounter for adjustment and management of vascular access device: Secondary | ICD-10-CM

## 2019-01-27 DIAGNOSIS — Z993 Dependence on wheelchair: Secondary | ICD-10-CM

## 2019-01-27 DIAGNOSIS — I959 Hypotension, unspecified: Secondary | ICD-10-CM | POA: Diagnosis present

## 2019-01-27 DIAGNOSIS — N179 Acute kidney failure, unspecified: Secondary | ICD-10-CM

## 2019-01-27 DIAGNOSIS — E1151 Type 2 diabetes mellitus with diabetic peripheral angiopathy without gangrene: Secondary | ICD-10-CM | POA: Diagnosis present

## 2019-01-27 DIAGNOSIS — I252 Old myocardial infarction: Secondary | ICD-10-CM

## 2019-01-27 DIAGNOSIS — G931 Anoxic brain damage, not elsewhere classified: Secondary | ICD-10-CM | POA: Diagnosis present

## 2019-01-27 DIAGNOSIS — E11319 Type 2 diabetes mellitus with unspecified diabetic retinopathy without macular edema: Secondary | ICD-10-CM | POA: Diagnosis present

## 2019-01-27 DIAGNOSIS — Z91018 Allergy to other foods: Secondary | ICD-10-CM

## 2019-01-27 DIAGNOSIS — I4581 Long QT syndrome: Secondary | ICD-10-CM | POA: Diagnosis not present

## 2019-01-27 DIAGNOSIS — Z79899 Other long term (current) drug therapy: Secondary | ICD-10-CM

## 2019-01-27 DIAGNOSIS — J9601 Acute respiratory failure with hypoxia: Secondary | ICD-10-CM | POA: Diagnosis present

## 2019-01-27 DIAGNOSIS — E1142 Type 2 diabetes mellitus with diabetic polyneuropathy: Secondary | ICD-10-CM | POA: Diagnosis present

## 2019-01-27 DIAGNOSIS — I2119 ST elevation (STEMI) myocardial infarction involving other coronary artery of inferior wall: Secondary | ICD-10-CM

## 2019-01-27 DIAGNOSIS — Z888 Allergy status to other drugs, medicaments and biological substances status: Secondary | ICD-10-CM

## 2019-01-27 DIAGNOSIS — E785 Hyperlipidemia, unspecified: Secondary | ICD-10-CM | POA: Diagnosis present

## 2019-01-27 DIAGNOSIS — N170 Acute kidney failure with tubular necrosis: Secondary | ICD-10-CM | POA: Diagnosis not present

## 2019-01-27 DIAGNOSIS — Z794 Long term (current) use of insulin: Secondary | ICD-10-CM

## 2019-01-27 DIAGNOSIS — Z8674 Personal history of sudden cardiac arrest: Secondary | ICD-10-CM

## 2019-01-27 DIAGNOSIS — I213 ST elevation (STEMI) myocardial infarction of unspecified site: Secondary | ICD-10-CM

## 2019-01-27 DIAGNOSIS — Z79891 Long term (current) use of opiate analgesic: Secondary | ICD-10-CM

## 2019-01-27 DIAGNOSIS — I4901 Ventricular fibrillation: Secondary | ICD-10-CM | POA: Diagnosis present

## 2019-01-27 DIAGNOSIS — G40901 Epilepsy, unspecified, not intractable, with status epilepticus: Secondary | ICD-10-CM | POA: Diagnosis present

## 2019-01-27 DIAGNOSIS — K219 Gastro-esophageal reflux disease without esophagitis: Secondary | ICD-10-CM | POA: Diagnosis present

## 2019-01-27 DIAGNOSIS — F329 Major depressive disorder, single episode, unspecified: Secondary | ICD-10-CM | POA: Diagnosis present

## 2019-01-27 DIAGNOSIS — I462 Cardiac arrest due to underlying cardiac condition: Secondary | ICD-10-CM | POA: Diagnosis present

## 2019-01-27 DIAGNOSIS — Y92239 Unspecified place in hospital as the place of occurrence of the external cause: Secondary | ICD-10-CM | POA: Diagnosis not present

## 2019-01-27 DIAGNOSIS — R570 Cardiogenic shock: Secondary | ICD-10-CM | POA: Diagnosis not present

## 2019-01-27 DIAGNOSIS — I4510 Unspecified right bundle-branch block: Secondary | ICD-10-CM | POA: Diagnosis present

## 2019-01-27 DIAGNOSIS — Z90710 Acquired absence of both cervix and uterus: Secondary | ICD-10-CM

## 2019-01-27 DIAGNOSIS — Z20828 Contact with and (suspected) exposure to other viral communicable diseases: Secondary | ICD-10-CM | POA: Diagnosis present

## 2019-01-27 HISTORY — PX: CORONARY/GRAFT ACUTE MI REVASCULARIZATION: CATH118305

## 2019-01-27 HISTORY — PX: LEFT HEART CATH AND CORONARY ANGIOGRAPHY: CATH118249

## 2019-01-27 HISTORY — PX: CORONARY THROMBECTOMY: CATH118304

## 2019-01-27 LAB — CBG MONITORING, ED: Glucose-Capillary: 216 mg/dL — ABNORMAL HIGH (ref 70–99)

## 2019-01-27 LAB — CBC WITH DIFFERENTIAL/PLATELET
Abs Immature Granulocytes: 0.45 10*3/uL — ABNORMAL HIGH (ref 0.00–0.07)
Basophils Absolute: 0 10*3/uL (ref 0.0–0.1)
Basophils Relative: 0 %
Eosinophils Absolute: 0.1 10*3/uL (ref 0.0–0.5)
Eosinophils Relative: 1 %
HCT: 50.3 % — ABNORMAL HIGH (ref 36.0–46.0)
Hemoglobin: 14.7 g/dL (ref 12.0–15.0)
Immature Granulocytes: 4 %
Lymphocytes Relative: 10 %
Lymphs Abs: 1.1 10*3/uL (ref 0.7–4.0)
MCH: 29.7 pg (ref 26.0–34.0)
MCHC: 29.2 g/dL — ABNORMAL LOW (ref 30.0–36.0)
MCV: 101.6 fL — ABNORMAL HIGH (ref 80.0–100.0)
Monocytes Absolute: 0.3 10*3/uL (ref 0.1–1.0)
Monocytes Relative: 3 %
Neutro Abs: 9 10*3/uL — ABNORMAL HIGH (ref 1.7–7.7)
Neutrophils Relative %: 82 %
Platelets: 185 10*3/uL (ref 150–400)
RBC: 4.95 MIL/uL (ref 3.87–5.11)
RDW: 15.3 % (ref 11.5–15.5)
WBC: 10.9 10*3/uL — ABNORMAL HIGH (ref 4.0–10.5)
nRBC: 0 % (ref 0.0–0.2)

## 2019-01-27 LAB — COMPREHENSIVE METABOLIC PANEL
ALT: 65 U/L — ABNORMAL HIGH (ref 0–44)
AST: 145 U/L — ABNORMAL HIGH (ref 15–41)
Albumin: 3.4 g/dL — ABNORMAL LOW (ref 3.5–5.0)
Alkaline Phosphatase: 129 U/L — ABNORMAL HIGH (ref 38–126)
Anion gap: 16 — ABNORMAL HIGH (ref 5–15)
BUN: 35 mg/dL — ABNORMAL HIGH (ref 8–23)
CO2: 24 mmol/L (ref 22–32)
Calcium: 8.3 mg/dL — ABNORMAL LOW (ref 8.9–10.3)
Chloride: 93 mmol/L — ABNORMAL LOW (ref 98–111)
Creatinine, Ser: 1.56 mg/dL — ABNORMAL HIGH (ref 0.44–1.00)
GFR calc Af Amer: 38 mL/min — ABNORMAL LOW (ref >60)
GFR calc non Af Amer: 33 mL/min — ABNORMAL LOW (ref >60)
Glucose, Bld: 270 mg/dL — ABNORMAL HIGH (ref 70–99)
Potassium: 5.2 mmol/L — ABNORMAL HIGH (ref 3.5–5.1)
Sodium: 133 mmol/L — ABNORMAL LOW (ref 135–145)
Total Bilirubin: 0.5 mg/dL (ref 0.3–1.2)
Total Protein: 7 g/dL (ref 6.5–8.1)

## 2019-01-27 LAB — PROTIME-INR
INR: 1.3 — ABNORMAL HIGH (ref 0.8–1.2)
Prothrombin Time: 15.6 seconds — ABNORMAL HIGH (ref 11.4–15.2)

## 2019-01-27 LAB — SARS CORONAVIRUS 2 BY RT PCR (HOSPITAL ORDER, PERFORMED IN ~~LOC~~ HOSPITAL LAB): SARS Coronavirus 2: NEGATIVE

## 2019-01-27 LAB — LACTIC ACID, PLASMA: Lactic Acid, Venous: 7.4 mmol/L (ref 0.5–1.9)

## 2019-01-27 SURGERY — CORONARY/GRAFT ACUTE MI REVASCULARIZATION
Anesthesia: LOCAL

## 2019-01-27 MED ORDER — NOREPINEPHRINE 4 MG/250ML-% IV SOLN
INTRAVENOUS | Status: AC
  Administered 2019-01-27: 5 ug/min
  Filled 2019-01-27: qty 250

## 2019-01-27 MED ORDER — SODIUM CHLORIDE 0.9 % IV BOLUS
500.0000 mL | Freq: Once | INTRAVENOUS | Status: AC
  Administered 2019-01-27: 23:00:00 via INTRAVENOUS

## 2019-01-27 MED ORDER — HEPARIN (PORCINE) IN NACL 1000-0.9 UT/500ML-% IV SOLN
INTRAVENOUS | Status: AC
  Filled 2019-01-27: qty 1000

## 2019-01-27 MED ORDER — ETOMIDATE 2 MG/ML IV SOLN
10.0000 mg | Freq: Once | INTRAVENOUS | Status: AC
  Administered 2019-01-27: 10 mg via INTRAVENOUS

## 2019-01-27 MED ORDER — LIDOCAINE HCL (PF) 1 % IJ SOLN
INTRAMUSCULAR | Status: AC
  Filled 2019-01-27: qty 30

## 2019-01-27 MED ORDER — HEPARIN SODIUM (PORCINE) 5000 UNIT/ML IJ SOLN
4000.0000 [IU] | Freq: Once | INTRAMUSCULAR | Status: AC
  Administered 2019-01-27: 4000 [IU] via INTRAVENOUS
  Filled 2019-01-27: qty 1

## 2019-01-27 MED ORDER — SODIUM BICARBONATE 8.4 % IV SOLN
INTRAVENOUS | Status: AC | PRN
  Administered 2019-01-27: 50 meq via INTRAVENOUS

## 2019-01-27 MED ORDER — EPINEPHRINE 1 MG/10ML IJ SOSY
PREFILLED_SYRINGE | INTRAMUSCULAR | Status: AC | PRN
  Administered 2019-01-27: 1 mg via INTRAVENOUS

## 2019-01-27 MED ORDER — ROCURONIUM BROMIDE 50 MG/5ML IV SOLN
80.0000 mg | Freq: Once | INTRAVENOUS | Status: AC
  Administered 2019-01-27: 80 mg via INTRAVENOUS

## 2019-01-27 SURGICAL SUPPLY — 18 items
CATH EXTRAC PRONTO 5.5F 138CM (CATHETERS) ×1 IMPLANT
CATH INFINITI 5 FR JL3.5 (CATHETERS) ×1 IMPLANT
CATH INFINITI 5FR MULTPACK ANG (CATHETERS) ×1 IMPLANT
CATH VISTA GUIDE 6FR JR4 (CATHETERS) ×1 IMPLANT
DEVICE RAD COMP TR BAND LRG (VASCULAR PRODUCTS) ×1 IMPLANT
GLIDESHEATH SLEND SS 6F .021 (SHEATH) ×1 IMPLANT
GUIDEWIRE INQWIRE 1.5J.035X260 (WIRE) IMPLANT
INQWIRE 1.5J .035X260CM (WIRE) ×2
KIT ENCORE 26 ADVANTAGE (KITS) ×1 IMPLANT
KIT HEART LEFT (KITS) ×2 IMPLANT
KIT HEMO VALVE WATCHDOG (MISCELLANEOUS) ×1 IMPLANT
PACK CARDIAC CATHETERIZATION (CUSTOM PROCEDURE TRAY) ×2 IMPLANT
SHEATH PINNACLE 6F 10CM (SHEATH) ×2 IMPLANT
SHEATH PROBE COVER 6X72 (BAG) ×1 IMPLANT
TRANSDUCER W/STOPCOCK (MISCELLANEOUS) ×2 IMPLANT
TUBING CIL FLEX 10 FLL-RA (TUBING) ×2 IMPLANT
WIRE ASAHI PROWATER 180CM (WIRE) ×1 IMPLANT
WIRE EMERALD 3MM-J .035X150CM (WIRE) ×1 IMPLANT

## 2019-01-27 NOTE — ED Triage Notes (Signed)
Patient arrived with EMS from home found unresponsive/apneicafter a seizure episode , CPR initiated by EMS prior to arrival received 3 Epinephrine IV for asystole , ROSC prior to arrival , intubated by EDP at arrival .

## 2019-01-27 NOTE — ED Notes (Signed)
ETT / OGT intact , IV sites unremarkable . EDP at bedside inserting CVC.

## 2019-01-27 NOTE — Code Documentation (Signed)
Pulse check: pulses present .

## 2019-01-27 NOTE — Code Documentation (Signed)
CPR initiated ( Asystole) , chest compressions initiated .

## 2019-01-27 NOTE — ED Notes (Signed)
Patient waiting for cath lab team , RT notified to stand by , IV sites intact / ETT and OGT unremarkable , Levophed drip infusing at 5 mcg/min. , daughter spoke with cardiologist and discussed patient condition /admission and plan of care .

## 2019-01-27 NOTE — ED Provider Notes (Signed)
[facility] Emergency Department Provider Note MRN:  [id number]  Arrival date & time: 01/31/2019     Chief Complaint   Post CPR   History of Present Illness   Kelsey Williams is a 73 y.o. year-old female with a history of CHF, diabetes, PAD presenting to the ED with chief complaint of post CPR.  Question of seizure activity or fainting spell at home.  Unresponsive.  EMS found patient pulseless.  Asystole for 18 minutes of CPR.  Return of spontaneous circulation after 3 rounds of epinephrine.  Review of Systems  Positive for cardiac arrest.  Patient's Health History    Past Medical History:  Diagnosis Date  . Aortic sclerosis   . CHF (congestive heart failure) ([place])   . Depression   . Diabetes mellitus without complication ([place])   . Diabetic neuropathy ([place])   . Diabetic retinopathy ([place])   . GERD (gastroesophageal reflux disease)   . H/O non-ST elevation myocardial infarction (NSTEMI)   . Hemorrhoids   . Hx of colonic polyps   . Hyperlipidemia   . Hypertension   . Hypertension   . Neurogenic bladder   . Neurogenic bladder   . PAD (peripheral artery disease) ([place])   . Renal disorder     Past Surgical History:  Procedure Laterality Date  . COLONOSCOPY  04/15/2012   Procedure: COLONOSCOPY;  Surgeon: Jeryl Columbia, MD;  Location: [facility] ENDOSCOPY;  Service: Endoscopy;  Laterality: N/A;  . Hysterectomy    . LAPAROSCOPIC CHOLECYSTECTOMY    . sacral decubitis debri    . SACRAL DECUBITUS ULCER EXCISION    . SUPRAPUBIC CATHETER PLACEMENT      Family History  Problem Relation Age of Onset  . Lung cancer Brother        smoker    Social History   Socioeconomic History  . Marital status: Married    Spouse name: Not on file  . Number of children: Not on file  . Years of education: Not on file  . Highest education level: Not on file  Occupational History  . Not on file  Social Needs  . Financial resource strain: Not on file  . Food insecurity   Worry: Not on file    Inability: Not on file  . Transportation needs    Medical: Not on file    Non-medical: Not on file  Tobacco Use  . Smoking status: Never Smoker  . Smokeless tobacco: Never Used  Substance and Sexual Activity  . Alcohol use: No  . Drug use: No  . Sexual activity: Not on file  Lifestyle  . Physical activity    Days per week: Not on file    Minutes per session: Not on file  . Stress: Not on file  Relationships  . Social Herbalist on phone: Not on file    Gets together: Not on file    Attends religious service: Not on file    Active member of club or organization: Not on file    Attends meetings of clubs or organizations: Not on file    Relationship status: Not on file  . Intimate partner violence    Fear of current or ex partner: Not on file    Emotionally abused: Not on file    Physically abused: Not on file    Forced sexual activity: Not on file  Other Topics Concern  . Not on file  Social History Narrative  . Not on file  Physical Exam  Vital Signs and Nursing Notes reviewed Vitals:   02/07/2019 2315 02/04/2019 2330  BP: (!) 187/77 (!) 145/59  Pulse: 95 71  Resp: (!) 3 16  SpO2: 100% 99%    CONSTITUTIONAL: Ill-appearing NEURO: Unresponsive, intermittently breathing on her own, intermittent gagging on [place] airway EYES:  eyes equal and reactive ENT/NECK:  no LAD, no JVD, scant blood in the oral airway CARDIO: Tachycardic rate, poorly perfused PULM:  CTAB no wheezing or rhonchi GI/GU:  normal bowel sounds, moderately distended, suprapubic catheter in place MSK/SPINE:  No gross deformities, no edema SKIN:  no rash, atraumatic PSYCH: Unable to assess  Diagnostic and Interventional Summary    EKG Interpretation  Date/Time:  Thursday January 27 2019 23:01:44 EDT Ventricular Rate:  128 PR Interval:    QRS Duration: 187 QT Interval:  384 QTC Calculation: 570 R Axis:   120 Text Interpretation:  Atrial fibrillation Right bundle  branch block ST depr, consider ischemia, anterolateral lds Confirmed by Gerlene Fee [phone]) on 01/20/2019 11:32:29 PM      Labs Reviewed  CBC WITH DIFFERENTIAL/PLATELET - Abnormal; Notable for the following components:      Result Value   WBC 10.9 (*)    HCT 50.3 (*)    MCV 101.6 (*)    MCHC 29.2 (*)    Neutro Abs 9.0 (*)    Abs Immature Granulocytes 0.45 (*)    All other components within normal limits  PROTIME-INR - Abnormal; Notable for the following components:   Prothrombin Time 15.6 (*)    INR 1.3 (*)    All other components within normal limits  LACTIC ACID, PLASMA - Abnormal; Notable for the following components:   Lactic Acid, Venous 7.4 (*)    All other components within normal limits  CBG MONITORING, ED - Abnormal; Notable for the following components:   Glucose-Capillary 216 (*)    All other components within normal limits  SARS CORONAVIRUS 2 (HOSPITAL ORDER, [place] LAB)  COMPREHENSIVE METABOLIC PANEL  LACTIC ACID, PLASMA  TROPONIN I (HIGH SENSITIVITY)    DG Chest Port 1 View    (Results Pending)    Medications  etomidate (AMIDATE) injection 10 mg (10 mg Intravenous Given 01/17/2019 2257)  rocuronium (ZEMURON) injection 80 mg (80 mg Intravenous Given 01/21/2019 2257)  EPINEPHrine (ADRENALIN) 1 MG/10ML injection (1 mg Intravenous Given 01/13/2019 2305)  sodium bicarbonate injection (50 mEq Intravenous Given 02/02/2019 2305)  norepinephrine (LEVOPHED) 4-5 MG/250ML-% infusion SOLN (5 mcg/min  New Bag/Given 01/28/2019 2314)  heparin injection 4,000 Units (4,000 Units Intravenous Given 02/09/2019 2337)  sodium chloride 0.9 % bolus 500 mL (0 mLs Intravenous Stopped 01/14/2019 2337)     Procedure Name: Intubation Date/Time: 01/30/2019 11:33 PM Performed by: Maudie Flakes, MD Pre-anesthesia Checklist: Patient identified, Patient being monitored, Emergency Drugs available, Timeout performed and Suction available Oxygen Delivery Method: Ambu bag Preoxygenation:  Pre-oxygenation with 100% oxygen Induction Type: Rapid sequence Ventilation: Mask ventilation without difficulty Laryngoscope Size: Glidescope and 3 Grade View: Grade III Tube size: 7.5 mm Number of attempts: 1 Airway Equipment and Method: Rigid stylet Placement Confirmation: ETT inserted through vocal cords under direct vision,  CO2 detector and Breath sounds checked- equal and bilateral Difficulty Due To: Difficulty was anticipated, Difficult Airway-  due to edematous airway and Difficult Airway- due to anterior larynx Comments: Difficult airway with blood, edema, very difficult landmarks.  Suction required to remove blood and help distinguish and find the vocal cords beneath the epiglottis.  Very anterior, but successful intubation using 10 mg etomidate and 80 mg rocuronium.     Procedure: CPR Patient suddenly became bradycardic and hypotensive and then lost pulses.  CPR was started immediately.  ACLS protocol was followed, patient received 1 mg epinephrine as well as 1 amp of bicarb.  CPR for 2 minutes.   Critical Care Critical Care Documentation Critical care time provided by me (excluding procedures): 32 minutes  Condition necessitating critical care: Cardiac arrest, management of return of spontaneous circulation, STEMI  Components of critical care management: reviewing of prior records, laboratory and imaging interpretation, frequent re-examination and reassessment of vital signs, administration of IV heparin, IV epinephrine, discussion with consulting services    ED Course and Medical Decision Making  I have reviewed the triage vital signs and the nursing notes.  Pertinent labs & imaging results that were available during my care of the patient were reviewed by me and considered in my medical decision making (see below for details).  Cardiac arrest in the field, 18 minutes of CPR, initial rhythm asystole, return of spontaneous circulation after 3 rounds of epinephrine.  On  arrival patient is with heart rate in the 80s, hypertensive.  Intubated as described above.  EKG revealing inferior STEMI.  Code STEMI initiated.  Shortly after, patient exhibited bradycardia and hypotension and lost pulses again.  1 round of CPR as described above followed by return of spontaneous circulation.  Started on norepinephrine drip.  Discussed with cardiology, will give heparin and bring to Cath Lab.  Barth Kirks. Sedonia Small, MD [place] [email or web address]  Final Clinical Impressions(s) / ED Diagnoses     ICD-10-CM   1. ST elevation myocardial infarction (STEMI), unspecified artery (HCC)  I21.3   2. H/O cardiac arrest  Z86.74 DG Chest [facility] 1 View    DG Chest [place] 1 View  3. Cardiac arrest [facility])  I46.9     ED Discharge Orders    None      Discharge Instructions Discussed with and Provided to Patient: Discharge Instructions   None       Maudie Flakes, MD 01/21/2019 2340

## 2019-01-27 NOTE — ED Notes (Signed)
Transported to cath lab 

## 2019-01-28 ENCOUNTER — Inpatient Hospital Stay (HOSPITAL_COMMUNITY): Payer: Medicare (Managed Care)

## 2019-01-28 ENCOUNTER — Encounter (HOSPITAL_COMMUNITY): Payer: Self-pay | Admitting: Interventional Cardiology

## 2019-01-28 DIAGNOSIS — E1159 Type 2 diabetes mellitus with other circulatory complications: Secondary | ICD-10-CM | POA: Diagnosis not present

## 2019-01-28 DIAGNOSIS — I4901 Ventricular fibrillation: Secondary | ICD-10-CM | POA: Diagnosis present

## 2019-01-28 DIAGNOSIS — K219 Gastro-esophageal reflux disease without esophagitis: Secondary | ICD-10-CM | POA: Diagnosis present

## 2019-01-28 DIAGNOSIS — N170 Acute kidney failure with tubular necrosis: Secondary | ICD-10-CM | POA: Diagnosis not present

## 2019-01-28 DIAGNOSIS — Z9049 Acquired absence of other specified parts of digestive tract: Secondary | ICD-10-CM | POA: Diagnosis not present

## 2019-01-28 DIAGNOSIS — Z66 Do not resuscitate: Secondary | ICD-10-CM | POA: Diagnosis not present

## 2019-01-28 DIAGNOSIS — I213 ST elevation (STEMI) myocardial infarction of unspecified site: Secondary | ICD-10-CM | POA: Diagnosis not present

## 2019-01-28 DIAGNOSIS — Y92239 Unspecified place in hospital as the place of occurrence of the external cause: Secondary | ICD-10-CM | POA: Diagnosis not present

## 2019-01-28 DIAGNOSIS — I2510 Atherosclerotic heart disease of native coronary artery without angina pectoris: Secondary | ICD-10-CM

## 2019-01-28 DIAGNOSIS — E785 Hyperlipidemia, unspecified: Secondary | ICD-10-CM | POA: Diagnosis present

## 2019-01-28 DIAGNOSIS — E11319 Type 2 diabetes mellitus with unspecified diabetic retinopathy without macular edema: Secondary | ICD-10-CM | POA: Diagnosis present

## 2019-01-28 DIAGNOSIS — I5031 Acute diastolic (congestive) heart failure: Secondary | ICD-10-CM

## 2019-01-28 DIAGNOSIS — N179 Acute kidney failure, unspecified: Secondary | ICD-10-CM

## 2019-01-28 DIAGNOSIS — I469 Cardiac arrest, cause unspecified: Secondary | ICD-10-CM | POA: Diagnosis present

## 2019-01-28 DIAGNOSIS — I4510 Unspecified right bundle-branch block: Secondary | ICD-10-CM | POA: Diagnosis present

## 2019-01-28 DIAGNOSIS — I2584 Coronary atherosclerosis due to calcified coronary lesion: Secondary | ICD-10-CM | POA: Diagnosis present

## 2019-01-28 DIAGNOSIS — Z90710 Acquired absence of both cervix and uterus: Secondary | ICD-10-CM | POA: Diagnosis not present

## 2019-01-28 DIAGNOSIS — I25110 Atherosclerotic heart disease of native coronary artery with unstable angina pectoris: Secondary | ICD-10-CM

## 2019-01-28 DIAGNOSIS — R569 Unspecified convulsions: Secondary | ICD-10-CM

## 2019-01-28 DIAGNOSIS — E1151 Type 2 diabetes mellitus with diabetic peripheral angiopathy without gangrene: Secondary | ICD-10-CM | POA: Diagnosis present

## 2019-01-28 DIAGNOSIS — I2109 ST elevation (STEMI) myocardial infarction involving other coronary artery of anterior wall: Secondary | ICD-10-CM | POA: Diagnosis present

## 2019-01-28 DIAGNOSIS — I2119 ST elevation (STEMI) myocardial infarction involving other coronary artery of inferior wall: Secondary | ICD-10-CM

## 2019-01-28 DIAGNOSIS — E872 Acidosis: Secondary | ICD-10-CM | POA: Diagnosis present

## 2019-01-28 DIAGNOSIS — I2111 ST elevation (STEMI) myocardial infarction involving right coronary artery: Secondary | ICD-10-CM

## 2019-01-28 DIAGNOSIS — I110 Hypertensive heart disease with heart failure: Secondary | ICD-10-CM | POA: Diagnosis present

## 2019-01-28 DIAGNOSIS — Z801 Family history of malignant neoplasm of trachea, bronchus and lung: Secondary | ICD-10-CM | POA: Diagnosis not present

## 2019-01-28 DIAGNOSIS — Z20828 Contact with and (suspected) exposure to other viral communicable diseases: Secondary | ICD-10-CM | POA: Diagnosis present

## 2019-01-28 DIAGNOSIS — Z86010 Personal history of colonic polyps: Secondary | ICD-10-CM | POA: Diagnosis not present

## 2019-01-28 DIAGNOSIS — G931 Anoxic brain damage, not elsewhere classified: Secondary | ICD-10-CM | POA: Diagnosis present

## 2019-01-28 DIAGNOSIS — F329 Major depressive disorder, single episode, unspecified: Secondary | ICD-10-CM | POA: Diagnosis present

## 2019-01-28 DIAGNOSIS — Z794 Long term (current) use of insulin: Secondary | ICD-10-CM

## 2019-01-28 DIAGNOSIS — I462 Cardiac arrest due to underlying cardiac condition: Secondary | ICD-10-CM | POA: Diagnosis present

## 2019-01-28 DIAGNOSIS — Z515 Encounter for palliative care: Secondary | ICD-10-CM | POA: Diagnosis not present

## 2019-01-28 DIAGNOSIS — J9601 Acute respiratory failure with hypoxia: Secondary | ICD-10-CM | POA: Diagnosis present

## 2019-01-28 LAB — POCT I-STAT 7, (LYTES, BLD GAS, ICA,H+H)
Acid-Base Excess: 3 mmol/L — ABNORMAL HIGH (ref 0.0–2.0)
Acid-base deficit: 4 mmol/L — ABNORMAL HIGH (ref 0.0–2.0)
Bicarbonate: 28.7 mmol/L — ABNORMAL HIGH (ref 20.0–28.0)
Bicarbonate: 22.4 mmol/L (ref 20.0–28.0)
Calcium, Ion: 1.05 mmol/L — ABNORMAL LOW (ref 1.15–1.40)
Calcium, Ion: 0.97 mmol/L — ABNORMAL LOW (ref 1.15–1.40)
HCT: 40 % (ref 36.0–46.0)
HCT: 31 % — ABNORMAL LOW (ref 36.0–46.0)
Hemoglobin: 13.6 g/dL (ref 12.0–15.0)
Hemoglobin: 10.5 g/dL — ABNORMAL LOW (ref 12.0–15.0)
O2 Saturation: 100 %
O2 Saturation: 90 %
Patient temperature: 36
Patient temperature: 35.5
Potassium: 5.1 mmol/L (ref 3.5–5.1)
Potassium: 6 mmol/L — ABNORMAL HIGH (ref 3.5–5.1)
Sodium: 134 mmol/L — ABNORMAL LOW (ref 135–145)
Sodium: 133 mmol/L — ABNORMAL LOW (ref 135–145)
TCO2: 30 mmol/L (ref 22–32)
TCO2: 24 mmol/L (ref 22–32)
pCO2 arterial: 45.2 mmHg (ref 32.0–48.0)
pCO2 arterial: 43.4 mmHg (ref 32.0–48.0)
pH, Arterial: 7.406 (ref 7.350–7.450)
pH, Arterial: 7.314 — ABNORMAL LOW (ref 7.350–7.450)
pO2, Arterial: 194 mmHg — ABNORMAL HIGH (ref 83.0–108.0)
pO2, Arterial: 60 mmHg — ABNORMAL LOW (ref 83.0–108.0)

## 2019-01-28 LAB — CBC
HCT: 43.3 % (ref 36.0–46.0)
Hemoglobin: 13.6 g/dL (ref 12.0–15.0)
MCH: 30.6 pg (ref 26.0–34.0)
MCHC: 31.4 g/dL (ref 30.0–36.0)
MCV: 97.3 fL (ref 80.0–100.0)
Platelets: 138 10*3/uL — ABNORMAL LOW (ref 150–400)
RBC: 4.45 MIL/uL (ref 3.87–5.11)
RDW: 15.1 % (ref 11.5–15.5)
WBC: 20.8 10*3/uL — ABNORMAL HIGH (ref 4.0–10.5)
nRBC: 0 % (ref 0.0–0.2)

## 2019-01-28 LAB — GLUCOSE, CAPILLARY
Glucose-Capillary: 226 mg/dL — ABNORMAL HIGH (ref 70–99)
Glucose-Capillary: 212 mg/dL — ABNORMAL HIGH (ref 70–99)
Glucose-Capillary: 188 mg/dL — ABNORMAL HIGH (ref 70–99)
Glucose-Capillary: 217 mg/dL — ABNORMAL HIGH (ref 70–99)
Glucose-Capillary: 223 mg/dL — ABNORMAL HIGH (ref 70–99)
Glucose-Capillary: 262 mg/dL — ABNORMAL HIGH (ref 70–99)
Glucose-Capillary: 285 mg/dL — ABNORMAL HIGH (ref 70–99)
Glucose-Capillary: 263 mg/dL — ABNORMAL HIGH (ref 70–99)

## 2019-01-28 LAB — BASIC METABOLIC PANEL
Anion gap: 15 (ref 5–15)
Anion gap: 11 (ref 5–15)
Anion gap: 25 — ABNORMAL HIGH (ref 5–15)
BUN: 37 mg/dL — ABNORMAL HIGH (ref 8–23)
BUN: 41 mg/dL — ABNORMAL HIGH (ref 8–23)
BUN: 43 mg/dL — ABNORMAL HIGH (ref 8–23)
CO2: 24 mmol/L (ref 22–32)
CO2: 25 mmol/L (ref 22–32)
CO2: 13 mmol/L — ABNORMAL LOW (ref 22–32)
Calcium: 8.1 mg/dL — ABNORMAL LOW (ref 8.9–10.3)
Calcium: 7.8 mg/dL — ABNORMAL LOW (ref 8.9–10.3)
Calcium: 7.3 mg/dL — ABNORMAL LOW (ref 8.9–10.3)
Chloride: 95 mmol/L — ABNORMAL LOW (ref 98–111)
Chloride: 98 mmol/L (ref 98–111)
Chloride: 98 mmol/L (ref 98–111)
Creatinine, Ser: 1.28 mg/dL — ABNORMAL HIGH (ref 0.44–1.00)
Creatinine, Ser: 1.31 mg/dL — ABNORMAL HIGH (ref 0.44–1.00)
Creatinine, Ser: 1.74 mg/dL — ABNORMAL HIGH (ref 0.44–1.00)
GFR calc Af Amer: 49 mL/min — ABNORMAL LOW (ref >60)
GFR calc Af Amer: 47 mL/min — ABNORMAL LOW (ref >60)
GFR calc Af Amer: 34 mL/min — ABNORMAL LOW (ref >60)
GFR calc non Af Amer: 42 mL/min — ABNORMAL LOW (ref >60)
GFR calc non Af Amer: 41 mL/min — ABNORMAL LOW (ref >60)
GFR calc non Af Amer: 29 mL/min — ABNORMAL LOW (ref >60)
Glucose, Bld: 214 mg/dL — ABNORMAL HIGH (ref 70–99)
Glucose, Bld: 223 mg/dL — ABNORMAL HIGH (ref 70–99)
Glucose, Bld: 282 mg/dL — ABNORMAL HIGH (ref 70–99)
Potassium: 5.2 mmol/L — ABNORMAL HIGH (ref 3.5–5.1)
Potassium: 5.3 mmol/L — ABNORMAL HIGH (ref 3.5–5.1)
Potassium: 4.7 mmol/L (ref 3.5–5.1)
Sodium: 134 mmol/L — ABNORMAL LOW (ref 135–145)
Sodium: 134 mmol/L — ABNORMAL LOW (ref 135–145)
Sodium: 136 mmol/L (ref 135–145)

## 2019-01-28 LAB — LIPID PANEL
Cholesterol: 64 mg/dL (ref 0–200)
HDL: 20 mg/dL — ABNORMAL LOW (ref >40)
LDL Cholesterol: 27 mg/dL (ref 0–99)
Total CHOL/HDL Ratio: 3.2 RATIO
Triglycerides: 87 mg/dL (ref <150)
VLDL: 17 mg/dL (ref 0–40)

## 2019-01-28 LAB — CBC WITH DIFFERENTIAL/PLATELET
Abs Immature Granulocytes: 0.16 10*3/uL — ABNORMAL HIGH (ref 0.00–0.07)
Basophils Absolute: 0 10*3/uL (ref 0.0–0.1)
Basophils Relative: 0 %
Eosinophils Absolute: 0 10*3/uL (ref 0.0–0.5)
Eosinophils Relative: 0 %
HCT: 38.5 % (ref 36.0–46.0)
Hemoglobin: 12.4 g/dL (ref 12.0–15.0)
Immature Granulocytes: 1 %
Lymphocytes Relative: 3 %
Lymphs Abs: 0.7 10*3/uL (ref 0.7–4.0)
MCH: 30.5 pg (ref 26.0–34.0)
MCHC: 32.2 g/dL (ref 30.0–36.0)
MCV: 94.6 fL (ref 80.0–100.0)
Monocytes Absolute: 2.2 10*3/uL — ABNORMAL HIGH (ref 0.1–1.0)
Monocytes Relative: 9 %
Neutro Abs: 19.9 10*3/uL — ABNORMAL HIGH (ref 1.7–7.7)
Neutrophils Relative %: 87 %
Platelets: 190 10*3/uL (ref 150–400)
RBC: 4.07 MIL/uL (ref 3.87–5.11)
RDW: 15.2 % (ref 11.5–15.5)
WBC: 22.9 10*3/uL — ABNORMAL HIGH (ref 4.0–10.5)
nRBC: 0 % (ref 0.0–0.2)

## 2019-01-28 LAB — TROPONIN I (HIGH SENSITIVITY)
Troponin I (High Sensitivity): 220 ng/L (ref <18)
Troponin I (High Sensitivity): 1818 ng/L (ref <18)

## 2019-01-28 LAB — PROTIME-INR
INR: 1.6 — ABNORMAL HIGH (ref 0.8–1.2)
INR: 1.5 — ABNORMAL HIGH (ref 0.8–1.2)
Prothrombin Time: 18.9 seconds — ABNORMAL HIGH (ref 11.4–15.2)
Prothrombin Time: 18.3 seconds — ABNORMAL HIGH (ref 11.4–15.2)

## 2019-01-28 LAB — APTT: aPTT: 47 seconds — ABNORMAL HIGH (ref 24–36)

## 2019-01-28 LAB — LACTIC ACID, PLASMA
Lactic Acid, Venous: 5.6 mmol/L (ref 0.5–1.9)
Lactic Acid, Venous: 5 mmol/L (ref 0.5–1.9)

## 2019-01-28 LAB — MAGNESIUM: Magnesium: 2.2 mg/dL (ref 1.7–2.4)

## 2019-01-28 LAB — MRSA PCR SCREENING: MRSA by PCR: NEGATIVE

## 2019-01-28 LAB — PHOSPHORUS: Phosphorus: 4.3 mg/dL (ref 2.5–4.6)

## 2019-01-28 LAB — POCT ACTIVATED CLOTTING TIME: Activated Clotting Time: 417 seconds

## 2019-01-28 MED ORDER — TICAGRELOR 90 MG PO TABS
180.0000 mg | ORAL_TABLET | Freq: Once | ORAL | Status: AC
  Administered 2019-01-28: 180 mg via ORAL
  Filled 2019-01-28: qty 2

## 2019-01-28 MED ORDER — CHLORHEXIDINE GLUCONATE CLOTH 2 % EX PADS: 6.0000 | MEDICATED_PAD | Freq: Every day | CUTANEOUS | Status: DC

## 2019-01-28 MED ORDER — SODIUM CHLORIDE 0.9 % IV SOLN
INTRAVENOUS | Status: DC
  Administered 2019-01-28 – 2019-01-29 (×2): via INTRAVENOUS

## 2019-01-28 MED ORDER — SODIUM CHLORIDE 0.9% FLUSH: 3.0000 mL | Freq: Two times a day (BID) | INTRAVENOUS | Status: DC

## 2019-01-28 MED ORDER — FENTANYL 2500MCG IN NS 250ML (10MCG/ML) PREMIX INFUSION
100.0000 ug/h | INTRAVENOUS | Status: DC
  Administered 2019-01-28: 100 ug/h via INTRAVENOUS
  Filled 2019-01-28: qty 250

## 2019-01-28 MED ORDER — HEPARIN SODIUM (PORCINE) 1000 UNIT/ML IJ SOLN
INTRAMUSCULAR | Status: DC | PRN
  Administered 2019-01-28: 10000 [IU] via INTRAVENOUS

## 2019-01-28 MED ORDER — HEPARIN SODIUM (PORCINE) 5000 UNIT/ML IJ SOLN
5000.0000 [IU] | Freq: Three times a day (TID) | INTRAMUSCULAR | Status: DC
  Administered 2019-01-28 – 2019-01-29 (×2): 5000 [IU] via SUBCUTANEOUS
  Filled 2019-01-28 (×2): qty 1

## 2019-01-28 MED ORDER — MIDAZOLAM HCL 2 MG/2ML IJ SOLN
INTRAMUSCULAR | Status: DC | PRN
  Administered 2019-01-28: 2 mg via INTRAVENOUS

## 2019-01-28 MED ORDER — ASPIRIN EC 81 MG PO TBEC: 81.0000 mg | DELAYED_RELEASE_TABLET | Freq: Every day | ORAL | Status: DC

## 2019-01-28 MED ORDER — NOREPINEPHRINE 4 MG/250ML-% IV SOLN
0.0000 ug/min | INTRAVENOUS | Status: DC
  Administered 2019-01-28: 5 ug/min via INTRAVENOUS
  Administered 2019-01-28: 35 ug/min via INTRAVENOUS
  Filled 2019-01-28 (×5): qty 250

## 2019-01-28 MED ORDER — TIROFIBAN HCL IV 12.5 MG/250 ML
0.0750 ug/kg/min | INTRAVENOUS | Status: AC
  Administered 2019-01-28: 0.075 ug/kg/min via INTRAVENOUS
  Filled 2019-01-28: qty 250

## 2019-01-28 MED ORDER — TICAGRELOR 90 MG PO TABS
90.0000 mg | ORAL_TABLET | Freq: Two times a day (BID) | ORAL | Status: DC
  Administered 2019-01-28 (×2): 90 mg via ORAL
  Filled 2019-01-28 (×2): qty 1

## 2019-01-28 MED ORDER — LIDOCAINE HCL (PF) 1 % IJ SOLN
INTRAMUSCULAR | Status: DC | PRN
  Administered 2019-01-28: 15 mL via SUBCUTANEOUS
  Administered 2019-01-28: 5 mL via SUBCUTANEOUS

## 2019-01-28 MED ORDER — NOREPINEPHRINE 16 MG/250ML-% IV SOLN
0.0000 ug/min | INTRAVENOUS | Status: DC
  Administered 2019-01-28: 10 ug/min via INTRAVENOUS
  Administered 2019-01-29 (×2): 50 ug/min via INTRAVENOUS
  Filled 2019-01-28: qty 250
  Filled 2019-01-28: qty 500

## 2019-01-28 MED ORDER — FENTANYL CITRATE (PF) 100 MCG/2ML IJ SOLN: 50.0000 ug | Freq: Once | INTRAMUSCULAR | Status: DC

## 2019-01-28 MED ORDER — HYDRALAZINE HCL 20 MG/ML IJ SOLN: 10.0000 mg | INTRAMUSCULAR | Status: AC | PRN

## 2019-01-28 MED ORDER — PROPOFOL 1000 MG/100ML IV EMUL
25.0000 ug/kg/min | INTRAVENOUS | Status: DC
  Administered 2019-01-28: 25 ug/kg/min via INTRAVENOUS
  Filled 2019-01-28: qty 100

## 2019-01-28 MED ORDER — HEPARIN SODIUM (PORCINE) 1000 UNIT/ML IJ SOLN
INTRAMUSCULAR | Status: AC
  Filled 2019-01-28: qty 1

## 2019-01-28 MED ORDER — ACETAMINOPHEN 325 MG PO TABS: 650.0000 mg | ORAL_TABLET | ORAL | Status: DC | PRN

## 2019-01-28 MED ORDER — INSULIN ASPART 100 UNIT/ML ~~LOC~~ SOLN
0.0000 [IU] | SUBCUTANEOUS | Status: DC
  Administered 2019-01-28: 5 [IU] via SUBCUTANEOUS
  Administered 2019-01-28: 3 [IU] via SUBCUTANEOUS
  Administered 2019-01-28 – 2019-01-29 (×3): 5 [IU] via SUBCUTANEOUS

## 2019-01-28 MED ORDER — ORAL CARE MOUTH RINSE
15.0000 mL | OROMUCOSAL | Status: DC
  Administered 2019-01-28 – 2019-01-29 (×10): 15 mL via OROMUCOSAL

## 2019-01-28 MED ORDER — ASPIRIN 81 MG PO CHEW
81.0000 mg | CHEWABLE_TABLET | Freq: Every day | ORAL | Status: DC
  Administered 2019-01-28: 81 mg via ORAL
  Filled 2019-01-28: qty 1

## 2019-01-28 MED ORDER — TIROFIBAN HCL IN NACL 5-0.9 MG/100ML-% IV SOLN
INTRAVENOUS | Status: AC | PRN
  Administered 2019-01-28: 0.075 ug/kg/min via INTRAVENOUS

## 2019-01-28 MED ORDER — TIROFIBAN (AGGRASTAT) BOLUS VIA INFUSION
INTRAVENOUS | Status: DC | PRN
  Administered 2019-01-28: 01:00:00 2250 ug via INTRAVENOUS

## 2019-01-28 MED ORDER — MIDAZOLAM HCL 2 MG/2ML IJ SOLN
INTRAMUSCULAR | Status: AC
  Filled 2019-01-28: qty 2

## 2019-01-28 MED ORDER — SODIUM CHLORIDE 0.9% FLUSH: 3.0000 mL | INTRAVENOUS | Status: DC | PRN

## 2019-01-28 MED ORDER — LEVETIRACETAM IN NACL 500 MG/100ML IV SOLN
500.0000 mg | Freq: Two times a day (BID) | INTRAVENOUS | Status: DC
  Administered 2019-01-28 – 2019-01-29 (×3): 500 mg via INTRAVENOUS
  Filled 2019-01-28 (×3): qty 100

## 2019-01-28 MED ORDER — ACETAMINOPHEN 500 MG PO TABS
1000.0000 mg | ORAL_TABLET | Freq: Four times a day (QID) | ORAL | Status: DC
  Administered 2019-01-28 – 2019-01-29 (×4): 1000 mg
  Filled 2019-01-28 (×4): qty 2

## 2019-01-28 MED ORDER — VASOPRESSIN 20 UNIT/ML IV SOLN
0.0300 [IU]/min | INTRAVENOUS | Status: DC
  Administered 2019-01-28: 0.03 [IU]/min via INTRAVENOUS
  Filled 2019-01-28 (×2): qty 2

## 2019-01-28 MED ORDER — SODIUM CHLORIDE 0.9 % IV SOLN: 250.0000 mL | INTRAVENOUS | Status: DC | PRN

## 2019-01-28 MED ORDER — CHLORHEXIDINE GLUCONATE 0.12% ORAL RINSE (MEDLINE KIT)
15.0000 mL | Freq: Two times a day (BID) | OROMUCOSAL | Status: DC
  Administered 2019-01-28 – 2019-01-29 (×3): 15 mL via OROMUCOSAL

## 2019-01-28 MED ORDER — LABETALOL HCL 5 MG/ML IV SOLN: 10.0000 mg | INTRAVENOUS | Status: AC | PRN

## 2019-01-28 MED ORDER — VITAL HIGH PROTEIN PO LIQD
1000.0000 mL | ORAL | Status: DC
  Administered 2019-01-29: 1000 mL

## 2019-01-28 MED ORDER — ONDANSETRON HCL 4 MG/2ML IJ SOLN: 4.0000 mg | Freq: Four times a day (QID) | INTRAMUSCULAR | Status: DC | PRN

## 2019-01-28 MED ORDER — INSULIN DETEMIR 100 UNIT/ML ~~LOC~~ SOLN
60.0000 [IU] | Freq: Every morning | SUBCUTANEOUS | Status: DC
  Filled 2019-01-28: qty 0.6

## 2019-01-28 MED ORDER — SODIUM CHLORIDE 0.9 % IV SOLN
INTRAVENOUS | Status: AC
  Administered 2019-01-28: 01:00:00 via INTRAVENOUS

## 2019-01-28 MED ORDER — FENTANYL BOLUS VIA INFUSION
25.0000 ug | INTRAVENOUS | Status: DC | PRN
  Filled 2019-01-28: qty 25

## 2019-01-28 MED ORDER — TIROFIBAN HCL IN NACL 5-0.9 MG/100ML-% IV SOLN
INTRAVENOUS | Status: AC
  Filled 2019-01-28: qty 100

## 2019-01-28 MED ORDER — MAGNESIUM SULFATE 2 GM/50ML IV SOLN
2.0000 g | Freq: Once | INTRAVENOUS | Status: AC
  Administered 2019-01-28: 2 g via INTRAVENOUS
  Filled 2019-01-28: qty 50

## 2019-01-28 MED ORDER — CHLORHEXIDINE GLUCONATE CLOTH 2 % EX PADS
6.0000 | MEDICATED_PAD | Freq: Every day | CUTANEOUS | Status: DC
  Administered 2019-01-28 – 2019-01-29 (×2): 6 via TOPICAL

## 2019-01-28 MED ORDER — HEPARIN (PORCINE) IN NACL 1000-0.9 UT/500ML-% IV SOLN
INTRAVENOUS | Status: AC
  Filled 2019-01-28: qty 500

## 2019-01-28 MED ORDER — INSULIN ASPART 100 UNIT/ML ~~LOC~~ SOLN
19.0000 [IU] | Freq: Three times a day (TID) | SUBCUTANEOUS | Status: DC
  Administered 2019-01-28: 19 [IU] via SUBCUTANEOUS

## 2019-01-28 MED ORDER — NITROGLYCERIN 1 MG/10 ML FOR IR/CATH LAB
INTRA_ARTERIAL | Status: AC
  Filled 2019-01-28: qty 10

## 2019-01-28 MED ORDER — EPINEPHRINE HCL 5 MG/250ML IV SOLN IN NS
0.5000 ug/min | INTRAVENOUS | Status: DC
  Administered 2019-01-28: 20 ug/min via INTRAVENOUS
  Administered 2019-01-28: 0.5 ug/min via INTRAVENOUS
  Administered 2019-01-29 (×2): 20 ug/min via INTRAVENOUS
  Filled 2019-01-28 (×6): qty 250

## 2019-01-28 MED ORDER — VERAPAMIL HCL 2.5 MG/ML IV SOLN
INTRAVENOUS | Status: DC | PRN
  Administered 2019-01-28: 10 mL via INTRA_ARTERIAL

## 2019-01-28 MED ORDER — MIDAZOLAM 50MG/50ML (1MG/ML) PREMIX INFUSION
0.5000 mg/h | INTRAVENOUS | Status: DC
  Administered 2019-01-28: 3 mg/h via INTRAVENOUS
  Filled 2019-01-28 (×3): qty 50

## 2019-01-28 MED ORDER — INSULIN DETEMIR 100 UNIT/ML ~~LOC~~ SOLN
10.0000 [IU] | Freq: Every day | SUBCUTANEOUS | Status: DC
  Administered 2019-01-28: 10 [IU] via SUBCUTANEOUS
  Filled 2019-01-28 (×2): qty 0.1

## 2019-01-28 MED ORDER — PHENYTOIN SODIUM 50 MG/ML IJ SOLN
100.0000 mg | Freq: Three times a day (TID) | INTRAMUSCULAR | Status: DC
  Filled 2019-01-28 (×2): qty 2

## 2019-01-28 MED ORDER — SODIUM CHLORIDE 0.9 % IV SOLN
15.0000 mg/kg | Freq: Once | INTRAVENOUS | Status: AC
  Administered 2019-01-28: 1354.5 mg via INTRAVENOUS
  Filled 2019-01-28: qty 27.09

## 2019-01-28 MED FILL — Heparin Sod (Porcine)-NaCl IV Soln 1000 Unit/500ML-0.9%: INTRAVENOUS | Qty: 1000 | Status: AC

## 2019-01-28 NOTE — Consult Note (Addendum)
Neurology Consultation Reason for Consult: seizure, cardiac arrest Referring Physician: Dr Kipp Brood  CC: seizure, cardiac arrest  History is obtained from: chart review as patient is comatose and intubated  HPI: Kelsey Williams is a 73 y.o. female with history of CHF, DM, PVD and CAD s/p NSTEMI in 2008, peripheral neuropathy and HTN who presented after out of the hospital cardiac arrest. Per chart review she was at home and had a syncopal episode or seizure like episode and was found down unresponsive. EMS arrived and patient was pulseless, required CPR and ROSC was achieved after about 18 minutes. On arrival to ED, she was taken to cath lab and found to have have anterior STEMI with thrombotic RCA lesion now s/p aspiration thrombectomy.   She was admitted in ICU and started on TTM. Team observed seizure like activity and therefore Neurology was consulted for seizure management as well as prognostication.   ROS: A complete point ROS was unable to obtain due to altered mental status.   Past Medical History:  Diagnosis Date  . Aortic sclerosis   . CHF (congestive heart failure) ([place])   . Depression   . Diabetes mellitus without complication ([place])   . Diabetic neuropathy ([place])   . Diabetic retinopathy ([place])   . GERD (gastroesophageal reflux disease)   . H/O non-ST elevation myocardial infarction (NSTEMI)   . Hemorrhoids   . Hx of colonic polyps   . Hyperlipidemia   . Hypertension   . Hypertension   . Neurogenic bladder   . Neurogenic bladder   . PAD (peripheral artery disease) ([place])   . Renal disorder     Family History  Problem Relation Age of Onset  . Lung cancer Brother        smoker    Social History: Per chart review she has never smoked. She has never used smokeless tobacco. She reports that she does not drink alcohol or use drugs.  Exam: Current vital signs: BP (!) 127/51   Pulse (!) 46   Temp (!) 96.8 F (36 C)   Resp 15   Ht 5\' 4"  (1.626 m)   Wt 90.3 kg  Comment: before pads on  SpO2 100%   BMI 34.17 kg/m  Vital signs in last 24 hours: Temp:  [96.6 F (35.9 C)-97.9 F (36.6 C)] 96.8 F (36 C) (09/18 1131) Pulse Rate:  [41-142] 46 (09/18 1118) Resp:  [0-99] 15 (09/18 1118) BP: (57-214)/(44-90) 127/51 (09/18 0900) SpO2:  [92 %-100 %] 100 % (09/18 1118) Arterial Line BP: (104-155)/(44-88) 123/49 (09/18 0900) FiO2 (%):  [40 %-100 %] 40 % (09/18 1118) Weight:  [90 kg-90.3 kg] 90.3 kg (09/18 0200)   Physical Exam  Constitutional: Appears well-developed and well-nourished.  Psych: unable to assess sec to intubation Eyes: No scleral injection HENT: No OP obstrucion Head: Normocephalic.  Cardiovascular: Normal rate and regular rhythm.  Respiratory: +intubated GI: Soft.  No distension. Skin: cold  Neuro: Mental Status: comatose, doesn't open eyes to noxious stimuli Cranial Nerves: pupils 39mm, not able to appreciate any reactivity to light, corneal reflex absent, gag reflex absent, rest CN unable to assess sec to intubation Motor and Sensory: flaccid and doesn't withdraw to noxious stimuli Deep Tendon Reflexes: absent Plantars: mute  I have reviewed labs in epic and the results pertinent to this consultation are: BG 223 Lactate 7.4-->5 INR 1.5 WBC 22.9 Na 134 K 5.3 BUN 41 Creat 1.31 GFR 41   I have reviewed the images obtained: CT head 01/28/2019: Chronic  atrophic and ischemic changes without acute abnormality.  Impression: 73yo F with cardiac arrest, ROS after about 18 minutes, on TTM now with seizure like activity. Routine eeg showed seizures.  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 If needed can increase to 750mg  BID - LTM while patient is on TTM - Will require MRI Brain after 72 hours to assess for extent of anoxic brain injury, likely Monday - History with prolonged time for  ROSC, Current neuro exam with no brainstem reflexes, EEG with diffuse suppression and seizures are all suggestive of diffuse anoxic brain injury. Will continue to assess for prognostication - seizure precautions - IV ativan for GTC>2 mins   Please page neurohospitalist of you have further questions.   CRITICAL CARE Performed by: Lora Havens   Total critical care time: 35 minutes  Critical care time was exclusive of separately billable procedures and treating other patients.  Critical care was necessary to treat or prevent imminent or life-threatening deterioration.  Critical care was time spent personally by me on the following activities: development of treatment plan with patient and/or surrogate as well as nursing, discussions with consultants, evaluation of patient's response to treatment, examination of patient, obtaining history from patient or surrogate, ordering and performing treatments and interventions, ordering and review of laboratory studies, ordering and review of radiographic studies, pulse oximetry and re-evaluation of patient's condition.

## 2019-01-28 NOTE — Progress Notes (Signed)
CT of brain with chronic changes only, will initiate hypothermia protocal at 36 degrees.

## 2019-01-28 NOTE — Progress Notes (Signed)
Chaplain responded to a code STEMI in the ED RESUS room. Kelsey Williams was unresponsive. MD contacted Daughter [name] [phone]) and Husband Joneen Boers [phone]). [name] was taken to the cath lab. Pt's family is on standby for MD's post cath lab phone call update. Chaplain remains available per request.   Chaplain Resident, Evelene Croon, M Div Pager # [phone]

## 2019-01-28 NOTE — Progress Notes (Signed)
Initial Nutrition Assessment  DOCUMENTATION CODES:   Obesity unspecified  INTERVENTION:   Initiate trickle TF: - Vital High Protein @ 20 ml/hr (480 ml/day) via OG tube  Trickle tube feeding regimen provides 480 kcal, 42 grams of protein, and 401 ml of H2O.   Trickle tube feeding and current propofol provides a total of 836 kcal.   Goal tube feeding regimen: - Vital High Protein @ 25 ml/hr (600 ml/day) via OG tube - Pro-stat 60 ml BID  Goal tube feeding regimen provides 1000 kcal, 113 grams of protein, and 502 ml of H2O.  Goal tube feeding regimen and current propofol provides a total of 1356 kcal.  NUTRITION DIAGNOSIS:   Inadequate oral intake related to inability to eat as evidenced by NPO status.  GOAL:   Provide needs based on ASPEN/SCCM guidelines  MONITOR:   Vent status, Labs, Weight trends, TF tolerance, I & O's  REASON FOR ASSESSMENT:   Ventilator, Consult Enteral/tube feeding initiation and management (trickles)  ASSESSMENT:   73 year old female who presented to the ED on 9/17 after being found unresponsive and pulseless. CPR initiated and ROSC obtained. PMH of CHF, T2DM, PAD, GERD, HLD, HTN. Pt required intubation in the ED. Pt was taken to the cath lab where an 80%, thrombotic culprit lesion was identified in the proximal RCA. Aspiration thrombectomy performed. Pt admitted with STEMI. TTM 36 degrees initiated.  OG tube in place, currently to low intermittent suction. Discussed pt with RN.  Received consult to start trickle tube feeds.  Patient is currently intubated on ventilator support MV: 10.9 L/min Temp (24hrs), Avg:97 F (36.1 C), Min:96.6 F (35.9 C), Max:97.9 F (36.6 C) BP (a-line): 162/51 MAP (a-line): 75  Drips: Propofol: 13.5 ml/hr (provides 356 kcal daily from lipid) Fentanyl: 10 ml/hr Levophed: 18.8 ml/hr Aggrastat: 8.1 ml/hr NS: 50 ml/hr  Medications reviewed and include: Novolog 19 units TID, Levemir 60 units daily  Labs  reviewed: sodium 134, ionized calcium 1.05, BUN 41, creatinine 1.31, elevated LFTs, lactic acid 5.6, IV magnesium sulfate 2 grams once CBG's: 188-226 x 24 hours  NUTRITION - FOCUSED PHYSICAL EXAM:    Most Recent Value  Orbital Region  No depletion  Upper Arm Region  No depletion  Thoracic and Lumbar Region  No depletion  Buccal Region  No depletion  Temple Region  No depletion  Clavicle Bone Region  Mild depletion  Clavicle and Acromion Bone Region  Mild depletion  Scapular Bone Region  Unable to assess  Dorsal Hand  No depletion  Patellar Region  No depletion  Anterior Thigh Region  Unable to assess  Posterior Calf Region  Mild depletion  Edema (RD Assessment)  None  Hair  Reviewed  Eyes  Reviewed  Mouth  Unable to assess  Skin  Reviewed  Nails  Reviewed       Diet Order:   Diet Order            Diet NPO time specified  Diet effective now              EDUCATION NEEDS:   No education needs have been identified at this time  Skin:  Skin Assessment: Reviewed RN Assessment  Last BM:  01/28/19  Height:   Ht Readings from Last 1 Encounters:  01/16/2019 5\' 4"  (1.626 m)    Weight:   Wt Readings from Last 1 Encounters:  01/28/19 90.3 kg    Ideal Body Weight:  54.5 kg  BMI:  Body mass index is 34.17 kg/m.  Estimated Nutritional Needs:   Kcal:  [phone]  Protein:  109-129 grams  Fluid:  >/= 1.2 L    Gaynell Face, MS, RD, LDN Inpatient Clinical Dietitian Pager: [phone] Weekend/After Hours: [phone]

## 2019-01-28 NOTE — Progress Notes (Signed)
EEG completed, results pending. 

## 2019-01-28 NOTE — Procedures (Signed)
Patient Name: SHABRE SEEBECK  MRN: [id number]  Epilepsy Attending: Lora Havens  Referring Physician/Provider: Dr Laurelyn Sickle Date: 01/28/2019 Duration: 23.38 mins  Patient history: 73yo F with cardiac arrest. EEG to evaluate for seizures  Level of alertness: comatose  Technical aspects: This EEG study was done with scalp electrodes positioned according to the 10-20 International system of electrode placement. Electrical activity was acquired at a sampling rate of 500Hz  and reviewed with a high frequency filter of 70Hz  and a low frequency filter of 1Hz . EEG data were recorded continuously and digitally stored.   DESCRIPTION: EEG showed generalized burst suppression. On tactile stimulation, patient was noted to have generalized rhythmic high amplitude sharply contoured 4-5Hz  theta slowing which was clinically associated with eye opening and left gaze deviation on video. Two such episodes were recorded during this study, at 1050 and 1055 am, lasting 30 second and 1 minute each. These are like stimulus induced rhythmic or periodic  Ictal discharges ( SIRPIDS). Hyperventilation and photic stimulation were not performed.  IMPRESSION: This study showed two seizures which appeared to have been elicited by stimulation as described above which is likely secondary to diffuse anoxic brain injury.. Additionally there was evidence of profound encephalopathy.   Arbell Wycoff Barbra Sarks

## 2019-01-28 NOTE — Progress Notes (Addendum)
Inpatient Diabetes Program Recommendations  AACE/ADA: New Consensus Statement on Inpatient Glycemic Control  Target Ranges:  Prepandial:   less than 140 mg/dL      Peak postprandial:   less than 180 mg/dL (1-2 hours)      Critically ill patients:  140 - 180 mg/dL   Results for Kelsey Williams, Kelsey Williams (MRN [id number]) as of 01/28/2019 08:13  Ref. Range 01/21/2019 23:13 01/28/2019 04:49 01/28/2019 07:56  Glucose-Capillary Latest Ref Range: 70 - 99 mg/dL 216 (H) 226 (H) 212 (H)   Review of Glycemic Control  Diabetes history: DM2 Outpatient Diabetes medications: Levemir 60 units QAM, Novolog 19 units TID with meals, Metformin 1000 mg BID Current orders for Inpatient glycemic control: Leveimr 60 units QAM, Novolog 19 units TID with meals  Inpatient Diabetes Program Recommendations:   Insulin - Basal: Please consider decreasing Levemir to 10 units daily.  Correction (SSI): Please order CBGs Q4H with Novolog 0-15 units Q4H.  Insulin - Meal Coverage: Patient is currently NPO, please discontinue Novolog 19 units TID with meals.  NOTE: In reviewing the chart, noted patient received Novolog 19 units at 8:15 am today. Currently ordered Novolog 19 units TID with meals which is intended to be meal coverage and patient is currently NPO. Sent K. Lowrey, RN secure chat to alert her that patient may experience hypoglycemia since she got the Novolog 19 units this morning. Also asked that RN discuss recommendations with MD in rounding. Would recommend checking glucose every hour for the next 4 hours.   Thanks, Barnie Alderman, RN, MSN, CDE Diabetes Coordinator Inpatient Diabetes Program [phone] (Team Pager from 8am to 5pm)

## 2019-01-28 NOTE — TOC Initial Note (Signed)
Transition of Care [facility]) - Initial/Assessment Note    Patient Details  Name: Kelsey Williams MRN: [id number] Date of Birth: Apr 10, 1946  [facility] CM/SW Contact:    Bethena Roys, RN Phone Number: 01/28/2019, 11:24 AM  Clinical Narrative:  Pt presented for cardiac arrest-stemi- post cath- on Brilinta. Benefits Check submitted. Patient continues on Levophed gtt. PTA from home with husband. [facility]. CM did speak with CSW following Lovena Le- Initial CSW is Paulisha. Once patient is stable she will need PT/OT to assist in recommendations for transition of care needs. CM will continue to monitor.               Expected Discharge Plan: Skilled Nursing Facility Barriers to Discharge: Continued Medical Work up   Expected Discharge Plan and Services Expected Discharge Plan: [place]   Discharge Planning Services: CM Consult   Living arrangements for the past 2 months: Single Family Home                  Prior Living Arrangements/Services Living arrangements for the past 2 months: Single Family Home Lives with:: Spouse      Emotional Assessment Appearance:: Appears stated age Alcohol / Substance Use: Not Applicable Psych Involvement: No (comment)  Admission diagnosis:  Cardiac arrest ([place]) [I46.9] H/O cardiac arrest [Z86.74] ST elevation myocardial infarction (STEMI), unspecified artery ([place]) [I21.3] Patient Active Problem List   Diagnosis Date Noted  . Cardiac arrest ([place]) 01/28/2019  . Asystole ([place]) 01/28/2019  . Coronary artery disease involving native coronary artery of native heart with unstable angina pectoris ([place]) 01/28/2019  . AKI (acute kidney injury) ([place]) 01/28/2019  . Acute diastolic heart failure ([place]) 01/28/2019  . Acute MI, inferior wall ([place])   . ST elevation myocardial infarction (STEMI) ([place])   . Internal and external bleeding hemorrhoids 04/13/2012  . CHF (congestive heart failure) ([place])  04/13/2012  . GERD (gastroesophageal reflux disease) 04/13/2012  . Peripheral neuropathy 04/13/2012  . Aortic sclerosis 04/13/2012  . Hyperlipidemia 04/13/2012  . Neurogenic bladder 04/13/2012  . History of colon polyps 04/13/2012  . Depression with anxiety 04/13/2012  . Peripheral arterial disease ([place]) 04/13/2012  . Proliferative diabetic retinopathy ([place]) 04/13/2012  . Paroxysmal atrial fibrillation ([place]) 04/13/2012  . DIABETES MELLITUS, TYPE II 10/21/2006  . GOUT NOS 10/21/2006  . SYSTOLIC HYPERTENSION [id number]  . FIBRILLATION, ATRIAL 10/21/2006  . HYSTERECTOMY, VAGINAL, HX OF 10/21/2006   PCP:  Janifer Adie, MD Pharmacy:  No Pharmacies Listed    Social Determinants of Health (SDOH) Interventions    Readmission Risk Interventions No flowsheet data found.

## 2019-01-28 NOTE — Progress Notes (Signed)
Pt transported to CT and back to [id number] without complications,

## 2019-01-28 NOTE — Consult Note (Addendum)
Cardiology Consultation:   Patient ID: Kelsey Williams MRN: [id number]; DOB: April 23, 1946  Admit date: 02/07/2019 Date of Consult: 01/28/2019  Primary Care Provider: Janifer Adie, MD Primary Cardiologist: No primary care provider on file.  Primary Electrophysiologist:  None    Patient Profile:   Kelsey Williams is a 73 y.o. female with a history of CAD s/p NSTEMI (2008), DM2 c/b peripheral neuropathy, and HTN who presents after out of hospital cardiac arrest found to have anterior STEMI with thrombotic RCA lesion now s/p aspiration thrombectomy.   History of Present Illness:   Kelsey Williams was at home with her husband this evening when she had what he thought was a "diabetic seizure" - she was shaking and unresponsive. He noted that she had two similar episodes earlier this week, but each were short and self-limited. When she did not arouse from this episode he called EMS, who found the patient asystolic. ROSC was achieved after 18 minutes with 3 round of epinephrine before she arrived in the [facility] where she had a recurrent bradycardic -> PEA arrest necessitating an additional 2-5 minutes of resuscitation efforts. Initial EKG post ROSC with clear STE in inferior leads. Patient intubated and without purposeful movement s/p RSI without additional sedation. Initial labs notable for Lactate 7.4, Cr 1.6 (0.9 in 2018), AST/ALT 145/65 with hsTn 220.   She was taken to the cath lab were an 80%, thrombotic culprit lesion was identified in the prox RCA. Aspiration thrombectomy performed - no stent deployed - and she was transferred to the CTICU on [place].   Of note, at her baseline she is mostly wheelchair bound on account of neuropathy but is reportedly ambulatory with a wheelchair. She is cared for by her husband. [facility] the [facility], but husband and daughter deny history of cognitive impairment or dementia.   Past Medical History:  Diagnosis Date   Aortic sclerosis    CHF (congestive  heart failure) (HCC)    Depression    Diabetes mellitus without complication (HCC)    Diabetic neuropathy (HCC)    Diabetic retinopathy ([place])    GERD (gastroesophageal reflux disease)    H/O non-ST elevation myocardial infarction (NSTEMI)    Hemorrhoids    Hx of colonic polyps    Hyperlipidemia    Hypertension    Hypertension    Neurogenic bladder    Neurogenic bladder    PAD (peripheral artery disease) ([place])    Renal disorder     Past Surgical History:  Procedure Laterality Date   COLONOSCOPY  04/15/2012   Procedure: COLONOSCOPY;  Surgeon: Jeryl Columbia, MD;  Location: [facility] ENDOSCOPY;  Service: Endoscopy;  Laterality: N/A;   Hysterectomy     LAPAROSCOPIC CHOLECYSTECTOMY     sacral decubitis debri     SACRAL DECUBITUS ULCER EXCISION     SUPRAPUBIC CATHETER PLACEMENT       Home Medications:  Prior to Admission medications   Medication Sig Start Date End Date Taking? Authorizing Provider  acetaminophen (TYLENOL) 500 MG tablet Take 500 mg by mouth 2 (two) times daily.    [provider]  ALPRAZolam Duanne Moron) 0.5 MG tablet Take 0.5 mg by mouth daily.    [provider]  amoxicillin-clavulanate (AUGMENTIN) 500-125 MG tablet Take 1 tablet (500 mg total) by mouth 2 (two) times daily. 02/26/17   Multani, Bhupinder, NP  aspirin EC 81 MG tablet Take 81 mg by mouth daily.    [provider]  benzonatate (TESSALON) 200 MG capsule Take  200 mg by mouth 3 (three) times daily as needed. For cough    [provider]  cetirizine (ZYRTEC) 10 MG tablet Take 10 mg by mouth daily.    [provider]  citalopram (CELEXA) 20 MG tablet Take 20 mg by mouth daily.    [provider]  furosemide (LASIX) 40 MG tablet Take 1 tablet (40 mg total) by mouth 2 (two) times daily. Take 2 tabs in the morning and 1 tab in the evening 04/15/12   Jessee Avers, MD  insulin aspart (NOVOLOG) 100 UNIT/ML injection Inject 19 Units into the skin 3  (three) times daily before meals.    [provider]  insulin detemir (LEVEMIR) 100 UNIT/ML injection Inject 60 Units into the skin every morning.    [provider]  ketoconazole (NIZORAL) 2 % shampoo Apply 1 application topically daily. Shampoo scalp and use on face and ear canals    [provider]  latanoprost (XALATAN) 0.005 % ophthalmic solution Place 1 drop into both eyes at bedtime.    [provider]  loratadine (CLARITIN) 10 MG tablet Take 10 mg by mouth daily.    [provider]  losartan (COZAAR) 100 MG tablet Take 100 mg by mouth daily.    [provider]  Menthol, Topical Analgesic, (BIOFREEZE ROLL-ON) 4 % GEL Apply 1 application topically 4 (four) times daily as needed. For pain    [provider]  metFORMIN (GLUCOPHAGE) 1000 MG tablet Take 1,000 mg by mouth 2 (two) times daily with a meal.    [provider]  metoprolol tartrate (LOPRESSOR) 25 MG tablet Take 25 mg by mouth 2 (two) times daily.    [provider]  miconazole (MICRO GUARD) 2 % powder Apply 1 application topically as needed. Apply under folds of skin for rash    [provider]  Multiple Vitamin (MULTIVITAMIN WITH MINERALS) TABS Take 1 tablet by mouth daily.    [provider]  pantoprazole (PROTONIX) 40 MG tablet Take 40 mg by mouth daily.    [provider]  polyethylene glycol (MIRALAX / GLYCOLAX) packet Take 17 g by mouth 2 (two) times daily. 04/15/12   Jessee Avers, MD  silver nitrate applicators [id number] % applicator Apply 1 application topically as needed. Apply to granulation tissue around stoma of supra pubic cath as needed    [provider]  simvastatin (ZOCOR) 40 MG tablet Take 40 mg by mouth at bedtime.    [provider]  traMADol (ULTRAM) 50 MG tablet Take 1 tablet (50 mg total) by mouth every 12 (twelve) hours as needed. 10/29/16   Dorie Rank, MD  triamcinolone cream (KENALOG) 0.1 %  Apply 1 application topically 2 (two) times daily.    [provider]  Wound Dressings (CARRASYN HYDROGEL WOUND DRESS EX) Apply 1 application topically 3 (three) times daily.    [provider]  zolpidem (AMBIEN) 10 MG tablet Take 10 mg by mouth at bedtime.    [provider]    Inpatient Medications: Scheduled Meds:  aspirin  81 mg Oral Daily   heparin  5,000 Units Subcutaneous Q8H   insulin aspart  19 Units Subcutaneous TID AC   insulin detemir  60 Units Subcutaneous q morning - 10a   sodium chloride flush  3 mL Intravenous Q12H   ticagrelor  180 mg Oral Once   ticagrelor  90 mg Oral BID   Continuous Infusions:  sodium chloride 50 mL/hr at 01/28/19 0120  sodium chloride     tirofiban     PRN Meds: sodium chloride, acetaminophen, hydrALAZINE, labetalol, ondansetron (ZOFRAN) IV, sodium chloride flush  Allergies:    Allergies  Allergen Reactions   Ace Inhibitors     REACTION: mental status changes   Other     Cauliflower anaphylactic reaction     Social History:   Social History   Socioeconomic History   Marital status: Married    Spouse name: Not on file   Number of children: Not on file   Years of education: Not on file   Highest education level: Not on file  Occupational History   Not on file  Social Needs   Financial resource strain: Not on file   Food insecurity    Worry: Not on file    Inability: Not on file   Transportation needs    Medical: Not on file    Non-medical: Not on file  Tobacco Use   Smoking status: Never Smoker   Smokeless tobacco: Never Used  Substance and Sexual Activity   Alcohol use: No   Drug use: No   Sexual activity: Not on file  Lifestyle   Physical activity    Days per week: Not on file    Minutes per session: Not on file   Stress: Not on file  Relationships   Social connections    Talks on phone: Not on file    Gets together: Not on file    Attends religious service:  Not on file    Active member of club or organization: Not on file    Attends meetings of clubs or organizations: Not on file    Relationship status: Not on file   Intimate partner violence    Fear of current or ex partner: Not on file    Emotionally abused: Not on file    Physically abused: Not on file    Forced sexual activity: Not on file  Other Topics Concern   Not on file  Social History Narrative   Not on file    Family History:    Family History  Problem Relation Age of Onset   Lung cancer Brother        smoker     Review of Systems: [y] = yes, [ ]  = no     Unable to assess  Physical Exam/Data:   Vitals:   01/28/19 0013 01/28/19 0115 01/28/19 0130 01/28/19 0145  BP:  114/64 (!) 117/58   Pulse:  (!) 58 (!) 59 (!) 59  Resp:  (!) 44 16 12  Temp:  97.9 F (36.6 C)    TempSrc:  Oral    SpO2: 100% 100% 100% 100%  Weight:      Height:        Intake/Output Summary (Last 24 hours) at 01/28/2019 0214 Last data filed at 01/28/2019 0100 Gross per 24 hour  Intake 1000 ml  Output 175 ml  Net 825 ml   Filed Weights   01/21/2019 2250  Weight: 90 kg   Body mass index is 34.06 kg/m.  General:  Intubated and sedated.  HEENT: pupils pinpoint. Dried blood around oral mucosa.  Lymph: no adenopathy Neck: no JVD Endocrine:  No thryomegaly Vascular: No carotid bruits; FA pulses 2+ bilaterally without bruits  Cardiac:  normal S1, S2; RRR; no murmur.  Lungs:  Mechanical breath sounds.  Abd: distended, tense.  Ext: no edema. Cool.  Musculoskeletal:  No deformities, BUE and BLE strength normal and equal  Skin: warm and dry  Neuro:  Overbreathing the vent.  Psych:  Normal affect   EKG:  The EKG was personally reviewed and demonstrates minimal residual STE II, III, AVF.   Relevant CV Studies: LHC 11/26/18:  Prox RCA lesion is 99% stenosed.  Post intervention with aspiration thrombectomy, there is a 10% residual stenosis. Embolism to the PDA resolved with advancing  wire across the thrombus.  Mid LAD lesion is 80% stenosed. Would consider PCI if she makes a meaningful neurologic recovery. This would likely require atherectomy.  The left ventricular systolic function is normal. This was obtained with on norepinephrine. This was stopped during the procedure.  The left ventricular ejection fraction is greater than 65% by visual estimate.  LV end diastolic pressure is moderately elevated. LVEDP 24 mm Hg.  There is no aortic valve stenosis.  Mid RCA lesion is 40% stenosed.  Laboratory Data:  Chemistry Recent Labs  Lab 01/13/2019 2311  NA 133*  K 5.2*  CL 93*  CO2 24  GLUCOSE 270*  BUN 35*  CREATININE 1.56*  CALCIUM 8.3*  GFRNONAA 33*  GFRAA 38*  ANIONGAP 16*    Recent Labs  Lab 01/26/2019 2311  PROT 7.0  ALBUMIN 3.4*  AST 145*  ALT 65*  ALKPHOS 129*  BILITOT 0.5   Hematology Recent Labs  Lab 01/26/2019 2311  WBC 10.9*  RBC 4.95  HGB 14.7  HCT 50.3*  MCV 101.6*  MCH 29.7  MCHC 29.2*  RDW 15.3  PLT 185   Cardiac EnzymesNo results for input(s): TROPONINI in the last 168 hours. No results for input(s): TROPIPOC in the last 168 hours.  BNPNo results for input(s): BNP, PROBNP in the last 168 hours.  DDimer No results for input(s): DDIMER in the last 168 hours.  Radiology/Studies:  Dg Chest Port 1 View  Result Date: 01/20/2019 CLINICAL DATA:  73 year old female status post cardiac arrest. EXAM: PORTABLE CHEST 1 VIEW COMPARISON:  Chest radiographs 04/13/2012. FINDINGS: Portable AP supine view at 2322 hours. Intubated, endotracheal tube tip about 10 millimeters above the carina. Enteric tube courses to the left abdomen, tip not included. Pacer or resuscitation pads project over the left chest. Mildly rotated to the left. Cardiomegaly. Bilateral increased pulmonary vascularity. Left lung base hypo ventilation. No pneumothorax. No definite pleural effusion. No acute rib fracture identified. IMPRESSION: 1. Satisfactory ET tube and enteric  tube placement. 2. Cardiomegaly, increased since 2013.  Acute pulmonary edema. 3. Additional left lung base opacity might reflect atelectasis or aspiration in this clinical setting. Electronically Signed   By: Genevie Ann M.D.   On: 01/17/2019 23:56    Assessment and Plan:   Kelsey Williams is an unfortunate 73 year old who has suffered a cardiac arrest with prolonged down time. It is unclear at this point whether the thrombotic RCA lesion was the cause of of her arrest, or whether her prolonged downtime and stasis of the bloodpool resulted in an embolic event. She remains intubated without evidence (as of yet) of purposeful neurologic recovery with a guarded prognosis.   #STEMI 2/2 thrombotic RCA lesion -- Continue Aggrastat gtt x 6 hours -- Would plan to continue DAPT with ASA, Brillinta. If bradycardia becomes an issue, would switch Brillinta -> Plavix with pharmacy assistance for reloading.  -- Please check lipid, a1c for risk stratification -- Order complete TTE; LV function preserved by LV gram.  -- Daily EKG  -- Agree with plan for targeted temperature management if CT head negative.  -- Appreciate excellent  CCM care.   Cardiology will continue to follow.   For questions or updates, please contact [place] Please consult [email or web address] for contact info under   Signed, Milus Banister, MD  01/28/2019 2:14 AM   I have examined the patient and reviewed assessment and plan and discussed with patient.  Agree with above as stated.  I personally reviewed the ECG and made the decision for the patient to have an emergency cath.  RCA revascularized.  Pressors turned off in cath lab.  Of note, patient has limited mobility from diabetic neuropathy.    Lactate > 7 which is a negative prognostic indicator.  Further management will be based on neuro recovery.  Larae Grooms

## 2019-01-28 NOTE — Progress Notes (Signed)
vLTM EEG running following spot EEG. Notified Neuro 

## 2019-01-28 NOTE — Progress Notes (Signed)
Chaplain responded to a page for code cool on 2 H room 05. Cooling was in progress. Pt appeared stable. No family present. Chaplain gave nurse contact information for husband and daughter. Chaplain remains available per request.   Chaplain Resident, Evelene Croon, M Div Pager # [phone]

## 2019-01-28 NOTE — Care Management (Signed)
Kelsey Williams W/[facility]  Ph#  [phone]  all medications are handle thru [place] with zero co-pay.

## 2019-01-28 NOTE — Progress Notes (Addendum)
Progress Note  Patient Name: Kelsey Williams Date of Encounter: 01/28/2019  Primary Cardiologist: No primary care provider on file.  [place], [place]  Subjective   Intubated/hypothermic/and sedated.  Inpatient Medications    Scheduled Meds: . aspirin  81 mg Oral Daily  . chlorhexidine gluconate (MEDLINE KIT)  15 mL Mouth Rinse BID  . Chlorhexidine Gluconate Cloth  6 each Topical Daily  . heparin  5,000 Units Subcutaneous Q8H  . insulin aspart  19 Units Subcutaneous TID AC  . insulin detemir  60 Units Subcutaneous q morning - 10a  . mouth rinse  15 mL Mouth Rinse 10 times per day  . sodium chloride flush  3 mL Intravenous Q12H  . ticagrelor  90 mg Oral BID   Continuous Infusions: . sodium chloride    . sodium chloride 50 mL/hr at 01/28/19 0800  . norepinephrine (LEVOPHED) Adult infusion 5 mcg/min (01/28/19 0800)  . tirofiban 0.075 mcg/kg/min (01/28/19 0800)   PRN Meds: sodium chloride, acetaminophen, ondansetron (ZOFRAN) IV, sodium chloride flush   Vital Signs    Vitals:   01/28/19 0645 01/28/19 0700 01/28/19 0800 01/28/19 0827  BP: (!) 97/50  (!) 131/52   Pulse: (!) 58  (!) 54 (!) 55  Resp: (!) 21  (!) 24 (!) 23  Temp:  (!) 97 F (36.1 C)    TempSrc:      SpO2: 100%  100% 100%  Weight:      Height:        Intake/Output Summary (Last 24 hours) at 01/28/2019 0852 Last data filed at 01/28/2019 0800 Gross per 24 hour  Intake 1436.62 ml  Output 565 ml  Net 871.62 ml   Last 3 Weights 01/28/2019 01/16/2019 12/16/2016  Weight (lbs) 199 lb 1.2 oz 198 lb 6.6 oz 172 lb  Weight (kg) 90.3 kg 90 kg 78.019 kg      Telemetry    Normal sinus rhythm- Personally Reviewed  ECG    Initial posted EKG on 01/14/2019 revealed inferior ST elevation with lateral ST depression.  At 2300.  An immediate second EKG at 2301 reveal accelerated idioventricular rhythmThe most recent EKG from 01/28/2019 at 6:46 AM reveals sinus bradycardia with prolonged QT related to hypothermia.  Right axis  deviation.- Personally Reviewed  Physical Exam  Intubated and sedated. GEN:  Multiple support apparatus attached Neck:  Unable to assess JVD Cardiac: RRR without useful auscultation due to apparatus noise. Respiratory: Clear to auscultation anterior chest. GI: non-distended  MS: No edema; No deformity. Neuro:  Comatose/sedated and paralyzed. Psych: Unable to assess  Labs    High Sensitivity Troponin:   Recent Labs  Lab 02/02/2019 2311 01/28/19 0250  TROPONINIHS 220* 1,818*      Chemistry Recent Labs  Lab 01/31/2019 2311 01/28/19 0250 01/28/19 0450 01/28/19 0453  NA 133* 134* 134* 134*  K 5.2* 5.2* 5.3* 5.1  CL 93* 95* 98  --   CO2 _0 --   GLUCOSE 270* 214* 223*  --   BUN 35* 37* 41*  --   CREATININE 1.56* 1.28* 1.31*  --   CALCIUM 8.3* 8.1* 7.8*  --   PROT 7.0  --   --   --   ALBUMIN 3.4*  --   --   --   AST 145*  --   --   --   ALT 65*  --   --   --   ALKPHOS 129*  --   --   --  BILITOT 0.5  --   --   --   GFRNONAA 33* 42* 41*  --   GFRAA 38* 49* 47*  --   ANIONGAP 16* 15 11  --      Hematology Recent Labs  Lab 02/06/2019 2311 01/28/19 0250 01/28/19 0450 01/28/19 0453  WBC 10.9* 20.8* 22.9*  --   RBC 4.95 4.45 4.07  --   HGB 14.7 13.6 12.4 13.6  HCT 50.3* 43.3 38.5 40.0  MCV 101.6* 97.3 94.6  --   MCH 29.7 30.6 30.5  --   MCHC 29.2* 31.4 32.2  --   RDW 15.3 15.1 15.2  --   PLT 185 138* 190  --     BNPNo results for input(s): BNP, PROBNP in the last 168 hours.   DDimer No results for input(s): DDIMER in the last 168 hours.   Radiology    Ct Head Wo Contrast  Result Date: 01/28/2019 CLINICAL DATA:  Recent seizure activity EXAM: CT HEAD WITHOUT CONTRAST TECHNIQUE: Contiguous axial images were obtained from the base of the skull through the vertex without intravenous contrast. COMPARISON:  None. FINDINGS: Brain: Scattered hypodensities are noted consistent with chronic white matter ischemic change. Mild atrophic changes are noted commenced  with patient's given age. No findings to suggest acute hemorrhage, acute infarction or space-occupying mass lesion are noted. Vascular: Contrast material is noted within the cerebral arteries consistent with the recent cardiac catheterization. Skull: Normal. Negative for fracture or focal lesion. Sinuses/Orbits: No acute finding. Other: None. IMPRESSION: Chronic atrophic and ischemic changes without acute abnormality. Electronically Signed   By: Inez Catalina M.D.   On: 01/28/2019 02:37   Dg Chest Port 1 View  Result Date: 02/09/2019 CLINICAL DATA:  73 year old female status post cardiac arrest. EXAM: PORTABLE CHEST 1 VIEW COMPARISON:  Chest radiographs 04/13/2012. FINDINGS: Portable AP supine view at 2322 hours. Intubated, endotracheal tube tip about 10 millimeters above the carina. Enteric tube courses to the left abdomen, tip not included. Pacer or resuscitation pads project over the left chest. Mildly rotated to the left. Cardiomegaly. Bilateral increased pulmonary vascularity. Left lung base hypo ventilation. No pneumothorax. No definite pleural effusion. No acute rib fracture identified. IMPRESSION: 1. Satisfactory ET tube and enteric tube placement. 2. Cardiomegaly, increased since 2013.  Acute pulmonary edema. 3. Additional left lung base opacity might reflect atelectasis or aspiration in this clinical setting. Electronically Signed   By: Genevie Ann M.D.   On: 01/14/2019 23:56    Cardiac Studies   Cardiac catheterization with PCI 01/17/2019: Intervention   LVEF 65%, with LVEDP 24 mmHg.  Patient Profile     73 y.o. female with type 2 diabetes, history of atrial fibrillation, PAD, history of diastolic heart failure, essential hypertension, who presents with out of hospital cardiac arrest with initial rhythm bradycardia asystolic.  Successful resuscitation with return of ROSC greater than 18 minutes of CPR but total downtime greater than 25 minutes..  Initial lactic acid greater than 7.  Angiography  demonstrated subtotally occluded proximal RCA treated with thrombectomy from 99% to less than 10% with TIMI grade III flow.  LVEF 60% with LVEDP 24 mmHg.  Assessment & Plan    1. Acute inferior ST elevation MI with spontaneous reperfusion likely resulting in reperfusion ventricular arrhythmias 2. Coronary artery disease with residual high-grade LAD 3. Cardiac arrest, initially bradycardia-asystolic.  Suspect degeneration to that rhythm from VF or VT.  Prolonged downtime greater than 20 minutes.  Initial lactic acid greater than 7.  Clinical  features predict a poor outcome. 4. Anoxic/ischemic encephalopathy of unknown magnitude.  Continue hypothermia protocol. 5. Acute diastolic heart failure: Initial elevation in LVEDP was in the setting of acute ischemia with IV pressors.  Normal LVEF suggests that heart failure will significantly improve once ischemic effects resolved and pressors are weaned.   We will follow with watchful waiting.  Should there be meaningful recovery, PCI of LAD would be warranted for improved long-term outcome.    Critical care time 25 minutes.   For questions or updates, please contact [place] Please consult [email or web address] for contact info under        Signed, Sinclair Grooms, MD  01/28/2019, 8:52 AM

## 2019-01-28 NOTE — Procedures (Signed)
Arterial Catheter Insertion Procedure Note Kelsey Williams [id number] 10-Nov-1945  Procedure: Insertion of Arterial Catheter  Indications: Blood pressure monitoring  Procedure Details Consent: Unable to obtain consent because of altered level of consciousness. Time Out: Verified patient identification, verified procedure, site/side was marked, verified correct patient position, special equipment/implants available, medications/allergies/relevent history reviewed, required imaging and test results available.  Performed  Maximum sterile technique was used including antiseptics, cap, gloves, gown, hand hygiene, mask and sheet. Skin prep: Chlorhexidine; local anesthetic administered 20 gauge catheter was inserted into right radial artery using the Seldinger technique. ULTRASOUND GUIDANCE USED: YES Evaluation Blood flow good; BP tracing good. Complications: No apparent complications.   Wyman Songster [facility] 01/28/2019

## 2019-01-28 NOTE — Progress Notes (Signed)
NAME:  Kelsey Williams, MRN:  [id number], DOB:  [birth year], LOS: 0 ADMISSION DATE:  02/05/2019, CONSULTATION DATE:  01/15/2019 REFERRING MD:  Sedonia Small, CHIEF COMPLAINT:  Cardiac arrest  Brief History   Patient is a 73 year old white female with a history of heart failure diabetes peripheral vascular disease and coronary artery disease s/p OOH cardiac arrest. 18 min of down time. heart cath: 99% proximal RCA stenosis, mid LAD 80%, mid RCA 40%. Thrombectomy of RCA successful.  No stent placement at that time. Upon arrival to ICU,  no oculogyric reflex no corneal reflex pupils are 2 mm and fixed.  Placed on cooling protocol  History of present illness   Patient is a 73 year old white female with a history of heart failure diabetes peripheral vascular disease and coronary artery disease who apparently had either a syncopal spell or seizure activity at home was unresponsive and apneic.  EMS was called they arrived she was asystolic she was resuscitated for 18 minutes with 3 rounds of 1 mg epinephrine given and return of circulatory effort.  He also had a brief episode similar in the emergency room.  She was taken to the Cath Lab and found to have 80% LAD intubated with a thrombectomy of clots in the RCA.  On my evaluation she recently arrived to the ICU patient has no oculogyric reflex no corneal reflex pupils are 2 mm and fixed.  She does overbreathing ventilator with a set respiratory rate of 6 up to 12.  Formal apnea test was not performed.  Past Medical History  DM 2 with retinopathy Hypertension Atrial fibrillation CHF Hyperlipidemia PVD  Significant Hospital Events   Repeat cardiac arrest in ED with ROSC in 2 min Cooling protocol initiated  Consults:  Cardiology  Procedures:  9/17 Intubation   Significant Diagnostic Tests:  9/17 LHC: 99% proximal RCA stenosis, mid LAD 80%, mid RCA 40%.  9/17 CT head: Chronic atrophic and ischemic changes without acute abnormality. 9/18 EEG  9/17 CXR:  cardiomegaly, pulm edema, left basilar opacity  Micro Data:  COVID neg  Antimicrobials:  None at this time  Interim history/subjective:  Pressures stable on levophed. Still somewhat bradycardic. Cooling protocol initiated.  Objective   Blood pressure (!) 97/50, pulse (!) 58, temperature (!) 97 F (36.1 C), resp. rate (!) 21, height 5\' 4"  (1.626 m), weight 90.3 kg, SpO2 100 %.    Vent Mode: PRVC FiO2 (%):  [40 %-100 %] 40 % Set Rate:  [14 bmp-18 bmp] 14 bmp Vt Set:  [430 mL-500 mL] 430 mL PEEP:  [5 cmH20-8 cmH20] 5 cmH20 Plateau Pressure:  [29 cmH20-35 cmH20] 29 cmH20   Intake/Output Summary (Last 24 hours) at 01/28/2019 0804 Last data filed at 01/28/2019 0700 Gross per 24 hour  Intake 1368.42 ml  Output 515 ml  Net 853.42 ml   Filed Weights   01/16/2019 2250 01/28/19 0200  Weight: 90 kg 90.3 kg    Examination: General: chronically ill appearing. shivering HENT: atraumatic. ETT present. Blood drainage from oral cavity however no lesions able to visulized. Copious clear nasal and ocular secretions. Lungs: rhonchi throughout.  Cardiovascular: bradycardic rate. Regular rhythm.  Abdomen: soft, nondistended. Suprapubic cath in place. Neuro: not awake or alert. Not on sedation/paralytic at time of exam. Doesn't follow commands. Pupillary reflex not appreciated. Spontaneous, non-purposeful eye blinking. No corneal reflex bilaterally. No reflex to threat bilaterally. No gag reflex. Does not withdraw to pain.    Resolved Hospital Problem list   none  Assessment & Plan:  This is a 73 year old female who presented to the ED via EMS s/p asystole cardiac arrest. Found to have 80% stenosis to LAD and RCA thrombosis.  OOH asystole cardiac arrest. ROSC achieved after 18 min. Recurrent PEA while in ED with ROSC after 2-5 min. STEMI 2/2 RCA lesion: 9/17 LHC: 99% proximal RCA thrombotic occlusion, mid LAD 80%, mid RCA 40%. Thrombectomy to RCA successful. No DES placement.  Trop 220-->1800  Plan ASA, Brillinta. Can switch to Plavix if issues with bradycardia. Lipid panel, A1C Echo post rewarming  TTM protocol 36 degrees. Will start sedation and pain management. Can add nimbex if she continues to shiver. Levophed   Acute hypoxic respiratory failure. Likely related to history of CHF and arrest. CXR significant for pulm edema and a left basilar opacity. Reactive leukocytosis 2/2  Plan Will continue to monitor for signs of infection.  Acute encephalopathy. S/p cardiac arrest. Likely anoxic/hypoxic injury related. Poor neurologic function at admission.  CT head indicates chronic ischemic changes without acute findings. EEG pending Neuroprognostication pending sedation wean  DM type II. SSI + 60U levemir Acute renal injury s/p cardiac arrest. Likely hypoperfusion and contrast dye related. Will continue to monitor.  Best practice:  Diet: NPO Pain/Anxiety/Delirium protocol (if indicated): fentanyl, propofol VAP protocol DVT prophylaxis: heparin Glucose control: SSI + levemir Mobility: BR Code Status: FULL. Daughter notes that [facility] will be faxing over her advanced directive today. Family Communication: Patient lives with husband who is her primary caregiver. Daughter, [name], at bedside this morning. Notes 3 prior episodes this week of patient shaking, frothing from mouth and LOC. Blood sugar was 40 on one of these occassions. Believes it was normal during this last episode. Also notes no CPR was given by husband prior to EMS arrival. Also notes that furosemide was recently decreased. Disposition: ICU   Mitzi Hansen, MD [place] PGY-1 01/28/19 8:04 AM

## 2019-01-28 NOTE — Procedures (Signed)
Central Venous Catheter Insertion Procedure Note Kelsey Williams [id number] 1946-02-24  Procedure: Insertion of Central Venous Catheter Indications: Drug and/or fluid administration  Procedure Details Consent: Risks of procedure as well as the alternatives and risks of each were explained to the (patient/caregiver).  Consent for procedure obtained. Time Out: Verified patient identification, verified procedure, site/side was marked, verified correct patient position, special equipment/implants available, medications/allergies/relevent history reviewed, required imaging and test results available.  Performed  Maximum sterile technique was used including antiseptics, cap, gloves, gown, hand hygiene, mask and sheet. Skin prep: Chlorhexidine; local anesthetic administered with 66mL of 1% epi A antimicrobial bonded/coated triple lumen catheter was placed in the right internal jugular vein using the Seldinger technique to 17cm. Line sutured. biopatch and sterile dressing applied.  Evaluation Blood flow good Complications: No apparent complications Patient did tolerate procedure well. Chest X-ray ordered to verify placement.  CXR: pending.   Kelsey Williams 01/28/2019, 2:56 PM

## 2019-01-28 NOTE — H&P (Signed)
NAME:  Kelsey Williams, MRN:  [id number], DOB:  [birth year], LOS: 0 ADMISSION DATE:  01/23/2019, CONSULTATION DATE: 01/28/2019 REFERRING MD: Dr. Sedonia Small, CHIEF COMPLAINT: Status post asystolic arrest  Brief History   SP arrest  History of present illness   Patient is a 73 year old white female with a history of heart failure diabetes peripheral vascular disease and coronary artery disease who apparently had either a syncopal spell or seizure activity at home was unresponsive and apneic.  EMS was called they arrived she was asystolic she was resuscitated for 18 minutes with 3 rounds of 1 mg epinephrine given and return of circulatory effort.  He also had a brief episode similar in the emergency room.  She was taken to the Cath Lab and found to have 80% LAD intubated with a thrombectomy of clots in the RCA.  On my evaluation she recently arrived to the ICU patient has no oculogyric reflex no corneal reflex pupils are 2 mm and fixed.  She does overbreathing ventilator with a set respiratory rate of 6 up to 12.  Formal apnea test was not performed.   Past Medical History   Diagnosis Date  . Aortic sclerosis   . CHF (congestive heart failure) ([place])   . Depression   . Diabetes mellitus without complication ([place])   . Diabetic neuropathy ([place])   . Diabetic retinopathy ([place])   . GERD (gastroesophageal reflux disease)   . H/O non-ST elevation myocardial infarction (NSTEMI)   . Hemorrhoids   . Hx of colonic polyps   . Hyperlipidemia   . Hypertension   . Hypertension   . Neurogenic bladder   . Neurogenic bladder   . PAD (peripheral artery disease) ([place])   . Renal disorder    Past Surgical History:  Procedure Laterality Date  . COLONOSCOPY  04/15/2012   Procedure: COLONOSCOPY;  Surgeon: Jeryl Columbia, MD;  Location: [facility] ENDOSCOPY;  Service: Endoscopy;  Laterality: N/A;  . Hysterectomy    . LAPAROSCOPIC CHOLECYSTECTOMY    . sacral decubitis debri    . SACRAL DECUBITUS ULCER  EXCISION    . [place] [facility] Events   Cardiac catheterization 01/28/2019  Consults:  Cardiology  Procedures:  Cardiac cath as above  Significant Diagnostic Tests:  As above  Micro Data:  NA  Antimicrobials:  NA Interim history/subjective:  NA  Objective   Blood pressure (!) 117/58, pulse (!) 59, temperature (P) 97.9 F (36.6 C), temperature source (P) Oral, resp. rate 16, height 5\' 4"  (1.626 m), weight 90 kg, SpO2 100 %.    Vent Mode: PRVC FiO2 (%):  [100 %] 100 % Set Rate:  [18 bmp] 18 bmp Vt Set:  [500 mL] 500 mL PEEP:  [8 cmH20] 8 cmH20 Plateau Pressure:  [35 cmH20] 35 cmH20   Intake/Output Summary (Last 24 hours) at 01/28/2019 0145 Last data filed at 01/28/2019 0100 Gross per 24 hour  Intake 1000 ml  Output 175 ml  Net 825 ml   Filed Weights   01/17/2019 2250  Weight: 90 kg    Examination: General: Light female appears stated age intubated unresponsive HENT: Some bleeding from the mouth around the ET tube. Lungs: Clear to auscultation Cardiovascular: Regular no murmurs or gallop Abdomen: Rotund benign Extremities: Within normal limits Neuro: See HPI GU: Within normal limits  Resolved Hospital Problem list   NA  Assessment & Plan:  1.  Status post asystolic arrest with unknown downtime 18 minutes probably  longer: Etiology of arrest is unclear.  Bystanders felt that started with a syncopal episode or seizure.  Will obtain CT scan of the head to rule out possible intracranial hemorrhage is cause prior to initiating hypothermia protocol  2.  Abnormal neurologic exam: If CT scan of the brain is unremarkable we will proceed with hypothermia protocol  3.  Anion gap acidosis: Manage acidosis with ventilator supplemental bicarbonate if necessary  4.  Coronary artery disease status post cardiac catheterization: Per cardiology currently on heparin Brilinta and Aggrastat  Best practice:  Diet: N.p.o.  Pain/Anxiety/Delirium protocol (if indicated): No sedation necessary at this time VAP protocol (if indicated): Yes DVT prophylaxis: Yes GI prophylaxis: Pepcid Glucose control: We will monitor Mobility: Bedrest Code Status: Full Family Communication: We will contact family after CT scan of the brain Disposition: To ICU for critical care with possible hypothermia protocol  Labs   CBC: Recent Labs  Lab 02/05/2019 2311  WBC 10.9*  NEUTROABS 9.0*  HGB 14.7  HCT 50.3*  MCV 101.6*  PLT [id number]    Basic Metabolic Panel: Recent Labs  Lab 01/26/2019 2311  NA 133*  K 5.2*  CL 93*  CO2 24  GLUCOSE 270*  BUN 35*  CREATININE 1.56*  CALCIUM 8.3*   GFR: Estimated Creatinine Clearance: 35.9 mL/min (A) (by C-G formula based on SCr of 1.56 mg/dL (H)). Recent Labs  Lab 01/11/2019 2302 01/20/2019 2311  WBC  --  10.9*  LATICACIDVEN 7.4*  --     Liver Function Tests: Recent Labs  Lab 02/06/2019 2311  AST 145*  ALT 65*  ALKPHOS 129*  BILITOT 0.5  PROT 7.0  ALBUMIN 3.4*   No results for input(s): LIPASE, AMYLASE in the last 168 hours. No results for input(s): AMMONIA in the last 168 hours.  ABG No results found for: PHART, PCO2ART, PO2ART, HCO3, TCO2, ACIDBASEDEF, O2SAT   Coagulation Profile: Recent Labs  Lab 01/14/2019 2311  INR 1.3*    Cardiac Enzymes: No results for input(s): CKTOTAL, CKMB, CKMBINDEX, TROPONINI in the last 168 hours.  HbA1C: Hgb A1c MFr Bld  Date/Time Value Ref Range Status  04/13/2012 10:40 PM 7.9 (H) <5.7 % Final    Comment:    (NOTE)                                                                       According to the ADA Clinical Practice Recommendations for 2011, when HbA1c is used as a screening test:  >=6.5%   Diagnostic of Diabetes Mellitus           (if abnormal result is confirmed) 5.7-6.4%   Increased risk of developing Diabetes Mellitus References:Diagnosis and Classification of Diabetes Mellitus,Diabetes [id number] 1):S62-S69 and  Standards of Medical Care in         Diabetes - 2011,Diabetes [id number] (Suppl 1):S11-S61.    CBG: Recent Labs  Lab 02/03/2019 2313  GLUCAP 216*    Review of Systems:   Unable to obtain  Past Medical History  She,  has a past medical history of Aortic sclerosis, CHF (congestive heart failure) ([place]), Depression, Diabetes mellitus without complication ([place]), Diabetic neuropathy ([place]), Diabetic retinopathy ([place]), GERD (gastroesophageal reflux disease), H/O non-ST elevation myocardial infarction (NSTEMI), Hemorrhoids, colonic polyps, Hyperlipidemia, Hypertension, Hypertension,  Neurogenic bladder, Neurogenic bladder, PAD (peripheral artery disease) ([place]), and Renal disorder.   Surgical History    Past Surgical History:  Procedure Laterality Date  . COLONOSCOPY  04/15/2012   Procedure: COLONOSCOPY;  Surgeon: Jeryl Columbia, MD;  Location: [facility] ENDOSCOPY;  Service: Endoscopy;  Laterality: N/A;  . Hysterectomy    . LAPAROSCOPIC CHOLECYSTECTOMY    . sacral decubitis debri    . SACRAL DECUBITUS ULCER EXCISION    . SUPRAPUBIC CATHETER PLACEMENT       Social History   reports that she has never smoked. She has never used smokeless tobacco. She reports that she does not drink alcohol or use drugs.   Family History   Her family history includes Lung cancer in her brother.   Allergies Allergies  Allergen Reactions  . Ace Inhibitors     REACTION: mental status changes  . Other     Cauliflower anaphylactic reaction      Home Medications  Prior to Admission medications   Medication Sig Start Date End Date Taking? Authorizing Provider  acetaminophen (TYLENOL) 500 MG tablet Take 500 mg by mouth 2 (two) times daily.    [provider]  ALPRAZolam Duanne Moron) 0.5 MG tablet Take 0.5 mg by mouth daily.    [provider]  amoxicillin-clavulanate (AUGMENTIN) 500-125 MG tablet Take 1 tablet (500 mg total) by mouth 2 (two) times daily. 02/26/17   Multani, Bhupinder, NP  aspirin EC  81 MG tablet Take 81 mg by mouth daily.    [provider]  benzonatate (TESSALON) 200 MG capsule Take 200 mg by mouth 3 (three) times daily as needed. For cough    [provider]  cetirizine (ZYRTEC) 10 MG tablet Take 10 mg by mouth daily.    [provider]  citalopram (CELEXA) 20 MG tablet Take 20 mg by mouth daily.    [provider]  furosemide (LASIX) 40 MG tablet Take 1 tablet (40 mg total) by mouth 2 (two) times daily. Take 2 tabs in the morning and 1 tab in the evening 04/15/12   Jessee Avers, MD  insulin aspart (NOVOLOG) 100 UNIT/ML injection Inject 19 Units into the skin 3 (three) times daily before meals.    [provider]  insulin detemir (LEVEMIR) 100 UNIT/ML injection Inject 60 Units into the skin every morning.    [provider]  ketoconazole (NIZORAL) 2 % shampoo Apply 1 application topically daily. Shampoo scalp and use on face and ear canals    [provider]  latanoprost (XALATAN) 0.005 % ophthalmic solution Place 1 drop into both eyes at bedtime.    [provider]  loratadine (CLARITIN) 10 MG tablet Take 10 mg by mouth daily.    [provider]  losartan (COZAAR) 100 MG tablet Take 100 mg by mouth daily.    [provider]  Menthol, Topical Analgesic, (BIOFREEZE ROLL-ON) 4 % GEL Apply 1 application topically 4 (four) times daily as needed. For pain    [provider]  metFORMIN (GLUCOPHAGE) 1000 MG tablet Take 1,000 mg by mouth 2 (two) times daily with a meal.    [provider]  metoprolol tartrate (LOPRESSOR) 25 MG tablet Take 25 mg by mouth 2 (two) times daily.    [provider]  miconazole (MICRO GUARD) 2 % powder Apply 1 application topically as needed. Apply under folds of skin for rash    [provider]  Multiple Vitamin (MULTIVITAMIN WITH MINERALS) TABS Take 1 tablet by  mouth daily.    [provider]  pantoprazole (PROTONIX) 40  MG tablet Take 40 mg by mouth daily.    [provider]  polyethylene glycol (MIRALAX / GLYCOLAX) packet Take 17 g by mouth 2 (two) times daily. 04/15/12   Jessee Avers, MD  silver nitrate applicators [id number] % applicator Apply 1 application topically as needed. Apply to granulation tissue around stoma of supra pubic cath as needed    [provider]  simvastatin (ZOCOR) 40 MG tablet Take 40 mg by mouth at bedtime.    [provider]  traMADol (ULTRAM) 50 MG tablet Take 1 tablet (50 mg total) by mouth every 12 (twelve) hours as needed. 10/29/16   Dorie Rank, MD  triamcinolone cream (KENALOG) 0.1 % Apply 1 application topically 2 (two) times daily.    [provider]  Wound Dressings (CARRASYN HYDROGEL WOUND DRESS EX) Apply 1 application topically 3 (three) times daily.    [provider]  zolpidem (AMBIEN) 10 MG tablet Take 10 mg by mouth at bedtime.    [provider]     Critical care time: Over 35 minutes was spent bedside evaluation, chart review, and critical care planning.  Will reevaluate once CT scan of the brain is performed.

## 2019-01-29 DIAGNOSIS — G931 Anoxic brain damage, not elsewhere classified: Secondary | ICD-10-CM

## 2019-01-29 DIAGNOSIS — I5031 Acute diastolic (congestive) heart failure: Secondary | ICD-10-CM

## 2019-01-29 DIAGNOSIS — J9601 Acute respiratory failure with hypoxia: Secondary | ICD-10-CM

## 2019-01-29 LAB — CBC
HCT: 32.9 % — ABNORMAL LOW (ref 36.0–46.0)
Hemoglobin: 9.7 g/dL — ABNORMAL LOW (ref 12.0–15.0)
MCH: 30 pg (ref 26.0–34.0)
MCHC: 29.5 g/dL — ABNORMAL LOW (ref 30.0–36.0)
MCV: 101.9 fL — ABNORMAL HIGH (ref 80.0–100.0)
Platelets: 199 10*3/uL (ref 150–400)
RBC: 3.23 MIL/uL — ABNORMAL LOW (ref 3.87–5.11)
RDW: 15.8 % — ABNORMAL HIGH (ref 11.5–15.5)
WBC: 20.5 10*3/uL — ABNORMAL HIGH (ref 4.0–10.5)
nRBC: 0.2 % (ref 0.0–0.2)

## 2019-01-29 LAB — BASIC METABOLIC PANEL
Anion gap: 26 — ABNORMAL HIGH (ref 5–15)
BUN: 48 mg/dL — ABNORMAL HIGH (ref 8–23)
CO2: 11 mmol/L — ABNORMAL LOW (ref 22–32)
Calcium: 7.4 mg/dL — ABNORMAL LOW (ref 8.9–10.3)
Chloride: 100 mmol/L (ref 98–111)
Creatinine, Ser: 2.13 mg/dL — ABNORMAL HIGH (ref 0.44–1.00)
GFR calc Af Amer: 26 mL/min — ABNORMAL LOW (ref >60)
GFR calc non Af Amer: 23 mL/min — ABNORMAL LOW (ref >60)
Glucose, Bld: 279 mg/dL — ABNORMAL HIGH (ref 70–99)
Potassium: 5.3 mmol/L — ABNORMAL HIGH (ref 3.5–5.1)
Sodium: 137 mmol/L (ref 135–145)

## 2019-01-29 LAB — MAGNESIUM: Magnesium: 2.8 mg/dL — ABNORMAL HIGH (ref 1.7–2.4)

## 2019-01-29 LAB — GLUCOSE, CAPILLARY
Glucose-Capillary: 251 mg/dL — ABNORMAL HIGH (ref 70–99)
Glucose-Capillary: 265 mg/dL — ABNORMAL HIGH (ref 70–99)
Glucose-Capillary: 267 mg/dL — ABNORMAL HIGH (ref 70–99)
Glucose-Capillary: 267 mg/dL — ABNORMAL HIGH (ref 70–99)
Glucose-Capillary: 269 mg/dL — ABNORMAL HIGH (ref 70–99)

## 2019-01-29 LAB — HEMOGLOBIN A1C
Hgb A1c MFr Bld: 6.5 % — ABNORMAL HIGH (ref 4.8–5.6)
Mean Plasma Glucose: 140 mg/dL

## 2019-01-29 MED ORDER — GLYCOPYRROLATE 1 MG PO TABS
1.0000 mg | ORAL_TABLET | ORAL | Status: DC | PRN
  Filled 2019-01-29: qty 1

## 2019-01-29 MED ORDER — GLYCOPYRROLATE 0.2 MG/ML IJ SOLN: 0.2000 mg | INTRAMUSCULAR | Status: DC | PRN

## 2019-01-29 MED ORDER — GLYCOPYRROLATE 0.2 MG/ML IJ SOLN
0.2000 mg | INTRAMUSCULAR | Status: DC | PRN
  Administered 2019-01-29: 0.2 mg via INTRAVENOUS
  Filled 2019-01-29: qty 1

## 2019-01-29 MED ORDER — ALBUMIN HUMAN 5 % IV SOLN
INTRAVENOUS | Status: AC
  Filled 2019-01-29: qty 250

## 2019-01-29 MED ORDER — ACETAMINOPHEN 650 MG RE SUPP: 650.0000 mg | Freq: Four times a day (QID) | RECTAL | Status: DC | PRN

## 2019-01-29 MED ORDER — DEXTROSE 5 % IV SOLN: INTRAVENOUS | Status: DC

## 2019-01-29 MED ORDER — ACETAMINOPHEN 325 MG PO TABS: 650.0000 mg | ORAL_TABLET | Freq: Four times a day (QID) | ORAL | Status: DC | PRN

## 2019-01-29 MED ORDER — INSULIN REGULAR(HUMAN) IN NACL 100-0.9 UT/100ML-% IV SOLN
INTRAVENOUS | Status: DC
  Administered 2019-01-29: 2.1 [IU]/h via INTRAVENOUS
  Filled 2019-01-29: qty 100

## 2019-01-29 MED ORDER — POLYVINYL ALCOHOL 1.4 % OP SOLN
1.0000 [drp] | Freq: Four times a day (QID) | OPHTHALMIC | Status: DC | PRN
  Filled 2019-01-29: qty 15

## 2019-01-29 MED ORDER — MORPHINE SULFATE (PF) 2 MG/ML IV SOLN: 2.0000 mg | INTRAVENOUS | Status: DC | PRN

## 2019-01-29 MED ORDER — MORPHINE 100MG IN NS 100ML (1MG/ML) PREMIX INFUSION
0.0000 mg/h | INTRAVENOUS | Status: DC
  Administered 2019-01-29: 10 mg/h via INTRAVENOUS
  Filled 2019-01-29: qty 100

## 2019-01-29 MED ORDER — DIPHENHYDRAMINE HCL 50 MG/ML IJ SOLN: 25.0000 mg | INTRAMUSCULAR | Status: DC | PRN

## 2019-01-29 MED ORDER — MORPHINE BOLUS VIA INFUSION
5.0000 mg | INTRAVENOUS | Status: DC | PRN
  Filled 2019-01-29: qty 5

## 2019-01-29 MED FILL — Medication: Qty: 1 | Status: AC

## 2019-01-31 MED FILL — Nitroglycerin IV Soln 100 MCG/ML in D5W: INTRA_ARTERIAL | Qty: 10 | Status: AC

## 2019-01-31 MED FILL — Heparin Sod (Porcine)-NaCl IV Soln 1000 Unit/500ML-0.9%: INTRAVENOUS | Qty: 500 | Status: AC

## 2019-02-03 ENCOUNTER — Telehealth: Payer: Self-pay

## 2019-02-03 NOTE — Telephone Encounter (Signed)
Received dc from [facility].  Dc is for burial and a patient of Doctor Nelda Marseille.   DC will be taken to [facility] 3[facility] for signature.

## 2019-02-04 NOTE — Telephone Encounter (Signed)
02/04/2019 Received signed D/C back from Dr. Nelda Marseille  I will call Fransisco Beau at [place] [facility] to pick up. PWR

## 2019-02-10 NOTE — Procedures (Addendum)
Patient Name: Kelsey Williams  MRN: [id number]  Epilepsy Attending: Lora Havens  Referring Physician/Provider: Dr Zeb Comfort Duration: 01/28/2019 1116 to 02/08/19 1211  Patient history: 72yo F with cardiac arrest. EEG to evaluate for seizures  Level of alertness: comatose  Technical aspects: This EEG study was done with scalp electrodes positioned according to the 10-20 International system of electrode placement. Electrical activity was acquired at a sampling rate of 500Hz  and reviewed with a high frequency filter of 70Hz  and a low frequency filter of 1Hz . EEG data were recorded continuously and digitally stored.   DESCRIPTION: EEG initially showed periods of generalized suppression alternating with generalized polyspikes and high amplitude polymorphic, sharply contoured 5-7hz  theta slowing. These sharply contoured slow waves appeared rhythmic at times but no clear evolution was seen and on video there were no clinical signs to suggest seizure. Gradually duration of EEG suppression reduced and EEG became more continuous with 2-5Hz  theta- delta slowing, maximal bifrontal. EEG appeared reactive to tactile stimuli. Hyperventilation and photic stimulation were not performed.  ABNORMALITY: 1. Suppression, generalized 2. Polyspikes, generalized 3. Continuous slow, generalized, maximal bifrontal  IMPRESSION: This study showed generalized epileptogenicity as well as profound encephalopathy which is likely secondary to diffuse anoxic brain injury.. NO definite seizures were seen.

## 2019-02-10 NOTE — Death Summary Note (Signed)
DEATH SUMMARY   Patient Details  Name: Kelsey Williams MRN: [id number] DOB: [birth year]  Admission/Discharge Information   Admit Date:  01/26/2019  Date of Death: Date of Death: 01-31-19  Time of Death: Time of Death: Aug 06, 1249  Length of Stay: 1  Referring Physician: Janifer Adie, MD   Reason(s) for Hospitalization  Cardiac arrest  Diagnoses  Preliminary cause of death:   Cardiac arrest Secondary Diagnoses (including complications and co-morbidities):  Active Problems:   Cardiac arrest (HCC)   Acute MI, inferior wall (HCC)   Asystole (HCC)   Coronary artery disease involving native coronary artery of native heart with unstable angina pectoris (HCC)   AKI (acute kidney injury) ([place])   Acute diastolic heart failure (HCC)   ST elevation myocardial infarction (STEMI) [facility])   Brief Hospital Course (including significant findings, care, treatment, and services provided and events leading to death)  Patient is a 73 year old white female with a history of heart failure diabetes peripheral vascular disease and coronary artery disease s/p OOH cardiac arrest. 18 min of down time.heart cath: 99% proximal RCA stenosis, mid LAD 80%, mid RCA 40%. Thrombectomy of RCA successful.  No stent placement at that time.Upon arrival to ICU,  no oculogyric reflex no corneal reflex pupils are 2 mm and fixed. Placed on cooling protocol.  I spoke with the family at length.  After discussion, decision was proceed with comfort care.  Morphine was started and patient was extubated to expire shortly thereafter with the family bedside.     Pertinent Labs and Studies  Significant Diagnostic Studies Ct Head Wo Contrast  Result Date: 01/28/2019 CLINICAL DATA:  Recent seizure activity EXAM: CT HEAD WITHOUT CONTRAST TECHNIQUE: Contiguous axial images were obtained from the base of the skull through the vertex without intravenous contrast. COMPARISON:  None. FINDINGS: Brain: Scattered hypodensities are noted  consistent with chronic white matter ischemic change. Mild atrophic changes are noted commenced with patient's given age. No findings to suggest acute hemorrhage, acute infarction or space-occupying mass lesion are noted. Vascular: Contrast material is noted within the cerebral arteries consistent with the recent cardiac catheterization. Skull: Normal. Negative for fracture or focal lesion. Sinuses/Orbits: No acute finding. Other: None. IMPRESSION: Chronic atrophic and ischemic changes without acute abnormality. Electronically Signed   By: Inez Catalina M.D.   On: 01/28/2019 02:37   Dg Chest Port 1 View  Result Date: 01/28/2019 CLINICAL DATA:  Central line placement, intubated EXAM: PORTABLE CHEST 1 VIEW COMPARISON:  29-Jan-2019 FINDINGS: Endotracheal tube terminates 10 mm above the carina. Right IJ venous catheter terminates at the cavoatrial junction. Enteric tube courses into the stomach. Cardiomegaly with mild interstitial edema. Possible small bilateral pleural effusions. No pneumothorax. IMPRESSION: Right IJ venous catheter terminates at the cavoatrial junction. Additional support apparatus as above. Cardiomegaly with mild interstitial edema. Electronically Signed   By: Julian Hy M.D.   On: 01/28/2019 15:09   Dg Chest Port 1 View  Result Date: 29-Jan-2019 CLINICAL DATA:  73 year old female status post cardiac arrest. EXAM: PORTABLE CHEST 1 VIEW COMPARISON:  Chest radiographs 04/13/2012. FINDINGS: Portable AP supine view at 2322 hours. Intubated, endotracheal tube tip about 10 millimeters above the carina. Enteric tube courses to the left abdomen, tip not included. Pacer or resuscitation pads project over the left chest. Mildly rotated to the left. Cardiomegaly. Bilateral increased pulmonary vascularity. Left lung base hypo ventilation. No pneumothorax. No definite pleural effusion. No acute rib fracture identified. IMPRESSION: 1. Satisfactory ET tube and enteric tube placement. 2. Cardiomegaly,  increased since 2013.  Acute pulmonary edema. 3. Additional left lung base opacity might reflect atelectasis or aspiration in this clinical setting. Electronically Signed   By: Genevie Ann M.D.   On: 01/30/2019 23:56    Microbiology No results found for this or any previous visit (from the past 240 hour(s)).  Lab Basic Metabolic Panel: No results for input(s): NA, K, CL, CO2, GLUCOSE, BUN, CREATININE, CALCIUM, MG, PHOS in the last 168 hours. Liver Function Tests: No results for input(s): AST, ALT, ALKPHOS, BILITOT, PROT, ALBUMIN in the last 168 hours. No results for input(s): LIPASE, AMYLASE in the last 168 hours. No results for input(s): AMMONIA in the last 168 hours. CBC: No results for input(s): WBC, NEUTROABS, HGB, HCT, MCV, PLT in the last 168 hours. Cardiac Enzymes: No results for input(s): CKTOTAL, CKMB, CKMBINDEX, TROPONINI in the last 168 hours. Sepsis Labs: No results for input(s): PROCALCITON, WBC, LATICACIDVEN in the last 168 hours.  Procedures/Operations     Jennet Maduro 02/07/2019, 1:19 PM

## 2019-02-10 NOTE — Progress Notes (Addendum)
NAME:  Kelsey Williams, MRN:  [id number], DOB:  [birth year], LOS: 1 ADMISSION DATE:  01/26/2019, CONSULTATION DATE:  01/25/2019 REFERRING MD:  Sedonia Small, CHIEF COMPLAINT:  Cardiac arrest  Brief History   Patient is a 73 year old white female with a history of heart failure diabetes peripheral vascular disease and coronary artery disease s/p OOH cardiac arrest. 18 min of down time. heart cath: 99% proximal RCA stenosis, mid LAD 80%, mid RCA 40%. Thrombectomy of RCA successful.  No stent placement at that time. Upon arrival to ICU,  no oculogyric reflex no corneal reflex pupils are 2 mm and fixed.  Placed on cooling protocol  History of present illness   Patient is a 73 year old white female with a history of heart failure diabetes peripheral vascular disease and coronary artery disease who apparently had either a syncopal spell or seizure activity at home was unresponsive and apneic.  EMS was called they arrived she was asystolic she was resuscitated for 18 minutes with 3 rounds of 1 mg epinephrine given and return of circulatory effort.  He also had a brief episode similar in the emergency room.  She was taken to the Cath Lab and found to have 80% LAD intubated with a thrombectomy of clots in the RCA.  On my evaluation she recently arrived to the ICU patient has no oculogyric reflex no corneal reflex pupils are 2 mm and fixed.  She does overbreathing ventilator with a set respiratory rate of 6 up to 12.  Formal apnea test was not performed.  Past Medical History  DM 2 with retinopathy Hypertension Atrial fibrillation CHF Hyperlipidemia PVD  Significant Hospital Events   Repeat cardiac arrest in ED with ROSC in 2 min Cooling protocol initiated  Consults:  Cardiology  Procedures:  9/17 Intubation   Significant Diagnostic Tests:  9/17 LHC: 99% proximal RCA stenosis, mid LAD 80%, mid RCA 40%.  9/17 CT head: Chronic atrophic and ischemic changes without acute abnormality. 9/18 EEG  9/17 CXR:  cardiomegaly, pulm edema, left basilar opacity  Micro Data:  COVID neg  Antimicrobials:  None at this time  Interim history/subjective:  Refractory hypotension at this point on multiple pressors  Objective   Blood pressure (!) 73/40, pulse 83, temperature 98.8 F (37.1 C), temperature source Oral, resp. rate 15, height 5\' 4"  (1.626 m), weight 93.1 kg, SpO2 96 %. CVP:  [9 mmHg] 9 mmHg  Vent Mode: PRVC FiO2 (%):  [40 %-80 %] 80 % Set Rate:  [14 bmp] 14 bmp Vt Set:  [430 mL] 430 mL PEEP:  [5 cmH20] 5 cmH20 Plateau Pressure:  [20 cmH20-26 cmH20] 26 cmH20   Intake/Output Summary (Last 24 hours) at 11-Feb-2019 0904 Last data filed at 11-Feb-2019 0600 Gross per 24 hour  Intake 4428.6 ml  Output 375 ml  Net 4053.6 ml   Filed Weights   01/24/2019 2250 01/28/19 0200 Feb 11, 2019 0500  Weight: 90 kg 90.3 kg 93.1 kg    Examination: General: chronically ill appearing. Unresponsive HENT: [place]/At, MMM, ETT in place. Lungs: Coarse diffusely Cardiovascular: RRR, Nl S1/S2 and -M/R/G Abdomen: Soft, NT, ND and +BS Neuro: Completely unresponsive, not arousable, not following command, no gag, no corneals, no pupillary and no EOM  I reviewed CXR myself, ETT ok  Resolved Hospital Problem list   none  Assessment & Plan:  This is a 73 year old female who presented to the ED via EMS s/p asystole cardiac arrest. Found to have 80% stenosis to LAD and RCA thrombosis.  OOH asystole  cardiac arrest. ROSC achieved after 18 min. Recurrent PEA while in ED with ROSC after 2-5 min. STEMI 2/2 RCA lesion: 9/17 LHC: 99% proximal RCA thrombotic occlusion, mid LAD 80%, mid RCA 40%. Thrombectomy to RCA successful. No DES placement.  Trop 220-->1800 Plan Levo D/C epi No further escalation of care  Acute hypoxic respiratory failure. Likely related to history of CHF and arrest. CXR significant for pulm edema and a left basilar opacity. Reactive leukocytosis 2/2  Plan No weaning, no escalation of vent settings   Acute encephalopathy. S/p cardiac arrest. Likely anoxic/hypoxic injury related. Poor neurologic function at admission.  No sedation No further escalation of care given hemodynamics  DM type II. SSI + 60U levemir Acute renal injury s/p cardiac arrest. Likely hypoperfusion and contrast dye related. Will continue to monitor.  Spoke with husband and two daughters over the phone, hemodynamics are very poor and mental status is very poor, no chances at improvement here.  After a long discussion decision was made to not escalate care further and withdraw once family arrives  Best practice:  Diet: NPO Pain/Anxiety/Delirium protocol (if indicated): fentanyl, propofol VAP protocol DVT prophylaxis: heparin Glucose control: SSI + levemir Mobility: BR Code Status: FULL. Daughter notes that [facility] will be faxing over her advanced directive today. Family Communication: Patient lives with husband who is her primary caregiver. Daughter, [name], at bedside this morning. Notes 3 prior episodes this week of patient shaking, frothing from mouth and LOC. Blood sugar was 40 on one of these occassions. Believes it was normal during this last episode. Also notes no CPR was given by husband prior to EMS arrival. Also notes that furosemide was recently decreased. Disposition: ICU  The patient is critically ill with multiple organ systems failure and requires high complexity decision making for assessment and support, frequent evaluation and titration of therapies, application of advanced monitoring technologies and extensive interpretation of multiple databases.   Critical Care Time devoted to patient care services described in this note is  45  Minutes. This time reflects time of care of this signee Dr Jennet Maduro. This critical care time does not reflect procedure time, or teaching time or supervisory time of PA/NP/Med student/Med Resident etc but could involve care discussion time.  Rush Farmer, M.D. [facility]  Pulmonary/Critical Care Medicine. Pager: [phone]. After hours pager: [phone].

## 2019-02-10 NOTE — Progress Notes (Signed)
Bedside Rn called Elink concerned that patient is maxed on blood pressure support. Epi 47mcg/min, levo 50 and vasopressin. BP currently 98/37, 106/40, 113/40 . And little to no UOP. D/w EMD. Aware of UOP and expected, continue to monitor. BP is acceptable. D/t degree of heart failure,  Goal SBP >90

## 2019-02-10 NOTE — Progress Notes (Signed)
Asystole noted on bedside ekg. Auscultated heart sounds for 1 min X 2 RN's [facility] RN Inis Sizer RN

## 2019-02-10 NOTE — Progress Notes (Signed)
RT NOTE: RT extubated patient to comfort per MD withdrawal order with RN at bedside. RN and family at bedside.

## 2019-02-10 NOTE — Progress Notes (Signed)
Spoke with husband and family, they are ready for withdrawal.  Will place withdrawal orderset in.  Rush Farmer, M.D. [facility] Pulmonary/Critical Care Medicine. Pager: [phone]. After hours pager: [phone].

## 2019-02-10 NOTE — Progress Notes (Signed)
Fentanyl 13ml. Versed 49ml, morphine 46ml wasted om stericycle as witnessed by Inis Sizer, RN.

## 2019-02-10 NOTE — Progress Notes (Signed)
vLTM discontinued.  No skin breakdown noted 

## 2019-02-10 NOTE — Progress Notes (Signed)
Progress Note  Patient Name: Kelsey Williams Date of Encounter: 2019-02-27  Primary Cardiologist: No primary care provider on file. Saw Dr. Ron Parker remotely  Subjective   Remains intubated, sedated with cooling protocol  Inpatient Medications    Scheduled Meds:  acetaminophen  1,000 mg Per Tube Q6H   aspirin  81 mg Oral Daily   chlorhexidine gluconate (MEDLINE KIT)  15 mL Mouth Rinse BID   Chlorhexidine Gluconate Cloth  6 each Topical Daily   feeding supplement (VITAL HIGH PROTEIN)  1,000 mL Per Tube Q24H   fentaNYL (SUBLIMAZE) injection  50 mcg Intravenous Once   heparin  5,000 Units Subcutaneous Q8H   insulin detemir  10 Units Subcutaneous Daily   mouth rinse  15 mL Mouth Rinse 10 times per day   sodium chloride flush  3 mL Intravenous Q12H   ticagrelor  90 mg Oral BID   Continuous Infusions:  sodium chloride     sodium chloride 50 mL/hr at 02-27-19 0600   albumin human     fentaNYL infusion INTRAVENOUS Stopped (February 27, 2019 0938)   insulin 2.1 Units/hr (27-Feb-2019 0602)   levETIRAcetam Stopped (02/27/2019 0059)   midazolam 0.5 mg/hr (February 27, 2019 0600)   norepinephrine (LEVOPHED) Adult infusion 50 mcg/min (02-27-19 0808)   vasopressin (PITRESSIN) infusion - *FOR SHOCK* 0.03 Units/min (2019/02/27 0600)   PRN Meds: sodium chloride, acetaminophen, fentaNYL, ondansetron (ZOFRAN) IV, sodium chloride flush   Vital Signs    Vitals:   February 27, 2019 0500 2019/02/27 0600 2019/02/27 0717 2019/02/27 0757  BP:   (!) 73/40   Pulse: 76 81 83   Resp: 20 (!) 31 15   Temp: (!) 97.3 F (36.3 C) 97.9 F (36.6 C)  98.8 F (37.1 C)  TempSrc: Esophageal Esophageal  Oral  SpO2: 96% 96% 96%   Weight: 93.1 kg     Height:        Intake/Output Summary (Last 24 hours) at February 27, 2019 0951 Last data filed at 27-Feb-2019 0600 Gross per 24 hour  Intake 4428.6 ml  Output 375 ml  Net 4053.6 ml   Filed Weights   01/20/2019 2250 01/28/19 0200 Feb 27, 2019 0500  Weight: 90 kg 90.3 kg 93.1 kg     Telemetry    nsr - Personally Reviewed  ECG    none - Personally Reviewed  Physical Exam   GEN: intubated and sedated  Neck: unable to assess JVD Cardiac: RRR, no murmurs, rubs, or gallops. distant Respiratory: Clear to auscultation bilaterally except for scattered basilar rales GI: Soft, nontender, non-distended  MS: No edema; No deformity. Cool. Neuro:  Nonfocal  Psych: Normal affect   Labs    Chemistry Recent Labs  Lab 01/23/2019 2311  01/28/19 0450  01/28/19 1348 01/28/19 1714 02-27-19 0327  NA 133*   < > 134*   < > 133* 136 137  K 5.2*   < > 5.3*   < > 6.0* 4.7 5.3*  CL 93*   < > 98  --   --  98 100  CO2 24   < > 25  --   --  13* 11*  GLUCOSE 270*   < > 223*  --   --  282* 279*  BUN 35*   < > 41*  --   --  43* 48*  CREATININE 1.56*   < > 1.31*  --   --  1.74* 2.13*  CALCIUM 8.3*   < > 7.8*  --   --  7.3* 7.4*  PROT 7.0  --   --   --   --   --   --  ALBUMIN 3.4*  --   --   --   --   --   --   AST 145*  --   --   --   --   --   --   ALT 65*  --   --   --   --   --   --   ALKPHOS 129*  --   --   --   --   --   --   BILITOT 0.5  --   --   --   --   --   --   GFRNONAA 33*   < > 41*  --   --  29* 23*  GFRAA 38*   < > 47*  --   --  34* 26*  ANIONGAP 16*   < > 11  --   --  25* 26*   < > = values in this interval not displayed.     Hematology Recent Labs  Lab 01/28/19 0250 01/28/19 0450 01/28/19 0453 01/28/19 1348 15-Feb-2019 0327  WBC 20.8* 22.9*  --   --  20.5*  RBC 4.45 4.07  --   --  3.23*  HGB 13.6 12.4 13.6 10.5* 9.7*  HCT 43.3 38.5 40.0 31.0* 32.9*  MCV 97.3 94.6  --   --  101.9*  MCH 30.6 30.5  --   --  30.0  MCHC 31.4 32.2  --   --  29.5*  RDW 15.1 15.2  --   --  15.8*  PLT 138* 190  --   --  199    Cardiac EnzymesNo results for input(s): TROPONINI in the last 168 hours. No results for input(s): TROPIPOC in the last 168 hours.   BNPNo results for input(s): BNP, PROBNP in the last 168 hours.   DDimer No results for input(s): DDIMER in the  last 168 hours.   Radiology    Ct Head Wo Contrast  Result Date: 01/28/2019 CLINICAL DATA:  Recent seizure activity EXAM: CT HEAD WITHOUT CONTRAST TECHNIQUE: Contiguous axial images were obtained from the base of the skull through the vertex without intravenous contrast. COMPARISON:  None. FINDINGS: Brain: Scattered hypodensities are noted consistent with chronic white matter ischemic change. Mild atrophic changes are noted commenced with patient's given age. No findings to suggest acute hemorrhage, acute infarction or space-occupying mass lesion are noted. Vascular: Contrast material is noted within the cerebral arteries consistent with the recent cardiac catheterization. Skull: Normal. Negative for fracture or focal lesion. Sinuses/Orbits: No acute finding. Other: None. IMPRESSION: Chronic atrophic and ischemic changes without acute abnormality. Electronically Signed   By: Inez Catalina M.D.   On: 01/28/2019 02:37   Dg Chest Port 1 View  Result Date: 01/28/2019 CLINICAL DATA:  Central line placement, intubated EXAM: PORTABLE CHEST 1 VIEW COMPARISON:  01/23/2019 FINDINGS: Endotracheal tube terminates 10 mm above the carina. Right IJ venous catheter terminates at the cavoatrial junction. Enteric tube courses into the stomach. Cardiomegaly with mild interstitial edema. Possible small bilateral pleural effusions. No pneumothorax. IMPRESSION: Right IJ venous catheter terminates at the cavoatrial junction. Additional support apparatus as above. Cardiomegaly with mild interstitial edema. Electronically Signed   By: Julian Hy M.D.   On: 01/28/2019 15:09   Dg Chest Port 1 View  Result Date: 01/25/2019 CLINICAL DATA:  73 year old female status post cardiac arrest. EXAM: PORTABLE CHEST 1 VIEW COMPARISON:  Chest radiographs 04/13/2012. FINDINGS: Portable AP supine view at 2322 hours. Intubated, endotracheal tube tip about 10 millimeters  above the carina. Enteric tube courses to the left abdomen, tip not  included. Pacer or resuscitation pads project over the left chest. Mildly rotated to the left. Cardiomegaly. Bilateral increased pulmonary vascularity. Left lung base hypo ventilation. No pneumothorax. No definite pleural effusion. No acute rib fracture identified. IMPRESSION: 1. Satisfactory ET tube and enteric tube placement. 2. Cardiomegaly, increased since 2013.  Acute pulmonary edema. 3. Additional left lung base opacity might reflect atelectasis or aspiration in this clinical setting. Electronically Signed   By: Genevie Ann M.D.   On: 01/30/2019 23:56    Cardiac Studies   Heart cath reviewed  Patient Profile     73 y.o. female admitted with out of hospital arrest, s/p PCI of occluded RCA, now with persistent altered mentation (on sedation) now with poor urine output (likely ATN ) and shock, likely cardiogenic.   Assessment & Plan    1. Cardiogenic shock - she is on maximal pressors. She is behaving like an RV infarction. Her prognosis is guarded.  2. VDRF - as per pulm CCM.  3. Acute inferior MI - she is s/p aspiration thrombectomy of a prox 99% RCA lesion. LVEDP was elevated at heart cath 4. Cardiac arrest - though the patient was found bradycardic, thought that she probably had VF. Unclear about this . She was down almost 20 minutes.  5. Disp. - in light of her down time and likely anoxic encephalopathy, she will be supported but will not escalate care at this point. Her prognosis is poor.  Cristopher Peru, M.D.     For questions or updates, please contact [place] Please consult [email or web address] for contact info under Cardiology/STEMI.      Signed, Cristopher Peru, MD  02/08/19, 9:51 AM  Patient ID: Murlean Iba, female   DOB: 1945/12/27, 73 y.o.   MRN: [id number]

## 2019-02-10 DEATH — deceased

## 2020-03-08 IMAGING — DX DG CHEST 1V PORT
2 series · 2 of 2 positions shown · non-contrast
Comparison: Chest radiographs 04/13/2012.

CLINICAL DATA: 72-year-old female status post cardiac arrest.

EXAM:
PORTABLE CHEST 1 VIEW

[chest ap (1 of 2)]
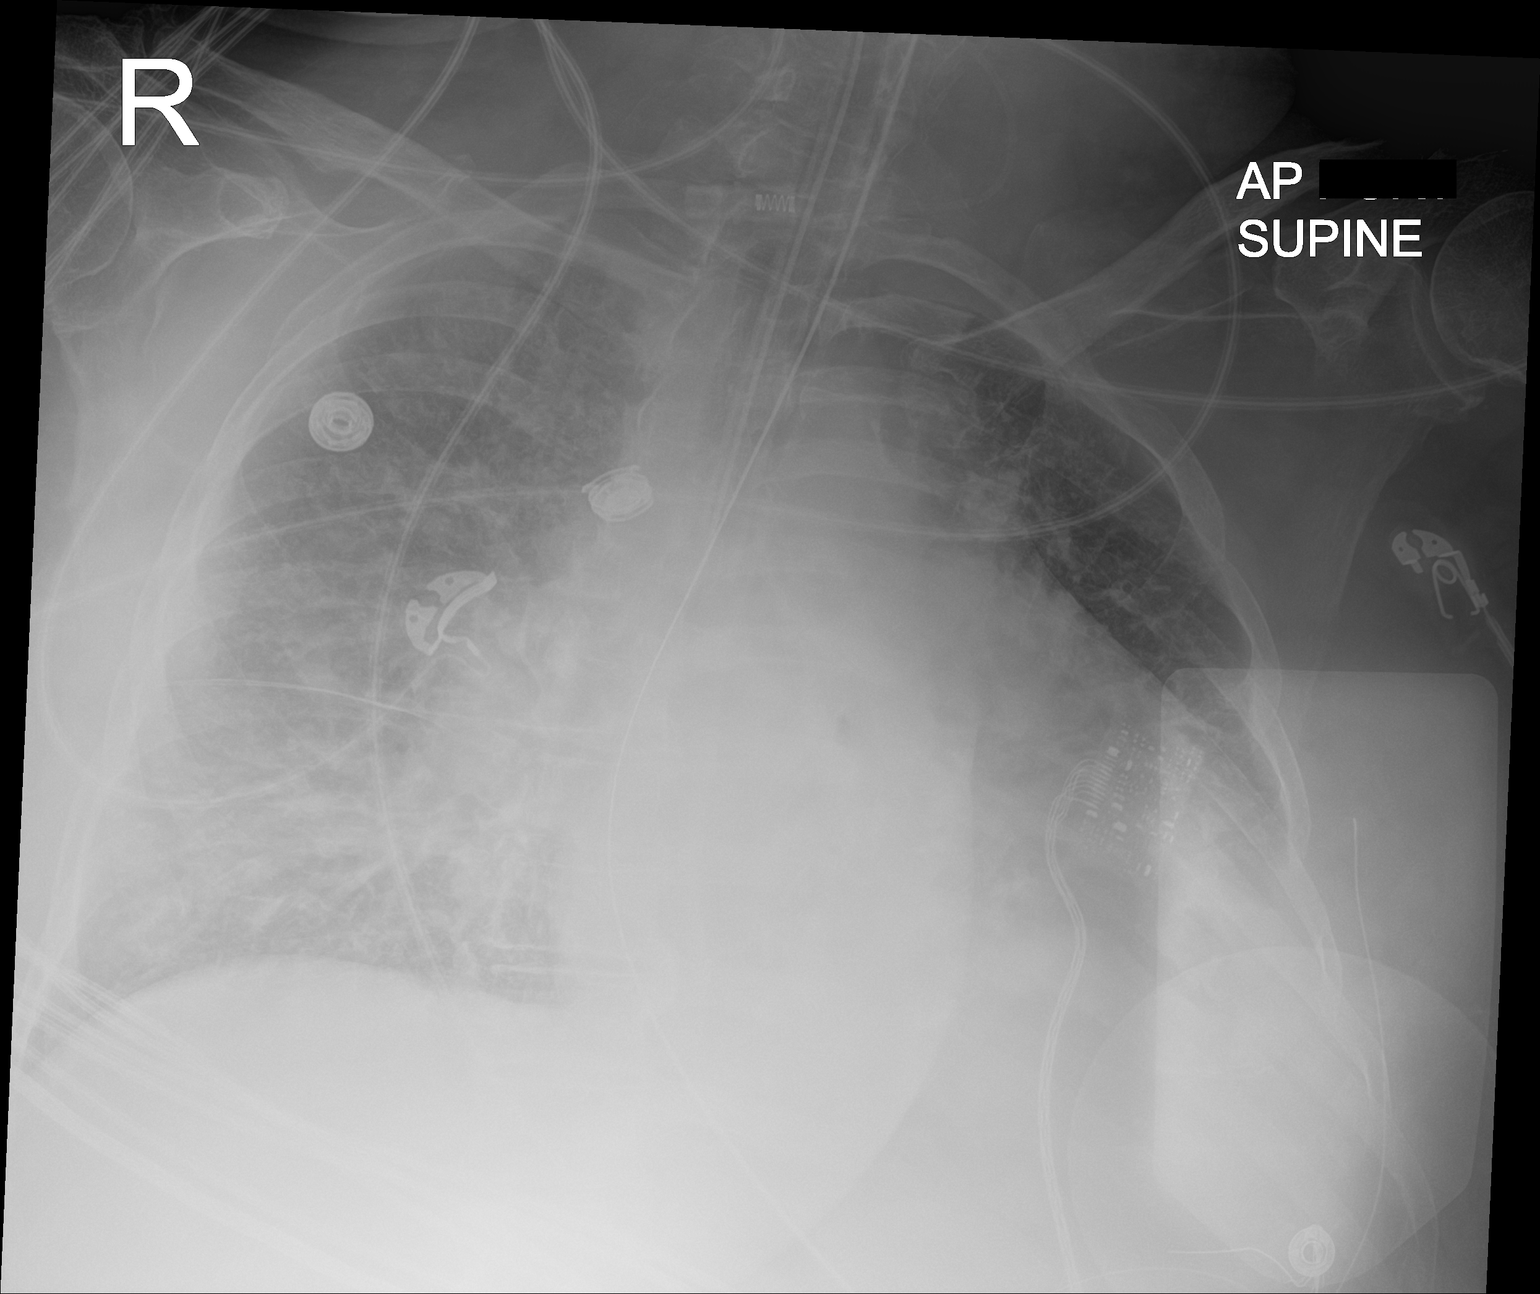

[chest ap (2 of 2)]
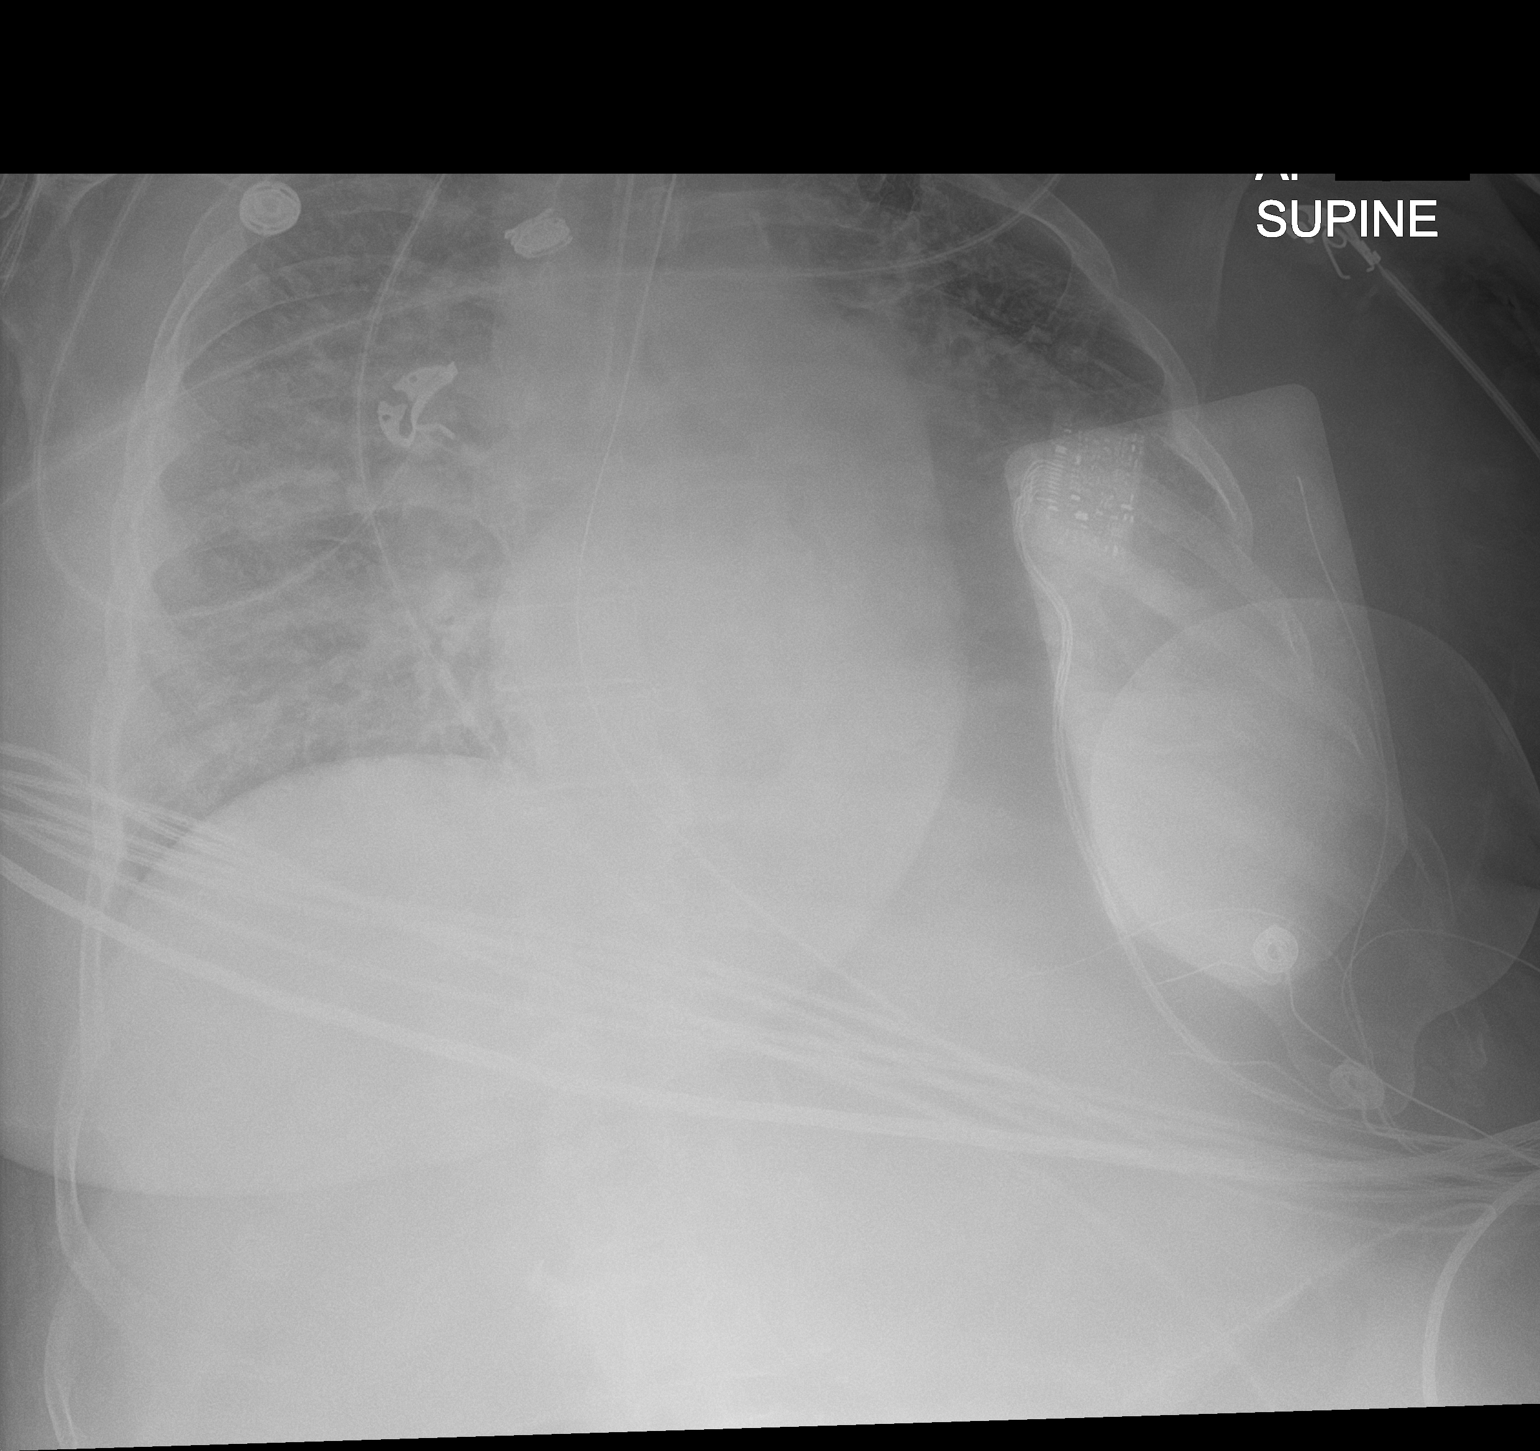

[2 of 2 positions shown; findings below may reference images not displayed]

FINDINGS: Portable AP supine view at 8888 hours. Intubated, endotracheal tube
tip about 10 millimeters above the carina. Enteric tube courses to
the left abdomen, tip not included. Pacer or resuscitation pads
project over the left chest. Mildly rotated to the left.
Cardiomegaly. Bilateral increased pulmonary vascularity. Left lung
base hypo ventilation. No pneumothorax. No definite pleural
effusion.

No acute rib fracture identified.
IMPRESSION: 1. Satisfactory ET tube and enteric tube placement.
2. Cardiomegaly, increased since 1880.  Acute pulmonary edema.
3. Additional left lung base opacity might reflect atelectasis or
aspiration in this clinical setting.

## 2020-03-09 IMAGING — CT CT HEAD W/O CM
4 series · 16 of 47 positions shown, 18 images · non-contrast
Comparison: None.

CLINICAL DATA: Recent seizure activity

EXAM:
CT HEAD WITHOUT CONTRAST
TECHNIQUE: Contiguous axial images were obtained from the base of the skull
through the vertex without intravenous contrast.

[Series 3: head wo · axial · 0.45mm/px · z∈[-136,-11]mm · 7 of 35 slices shown, 9 images]
[im 5/35  brain]
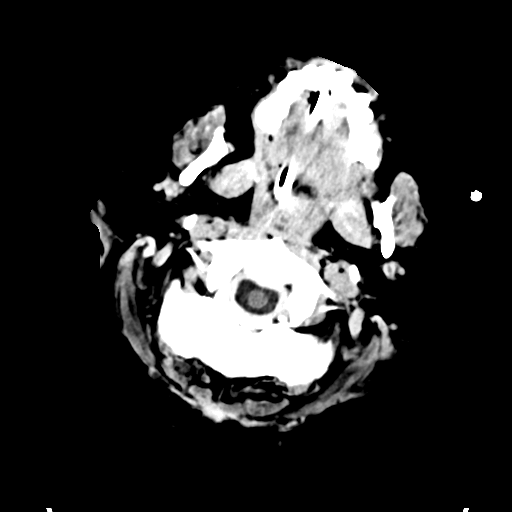
[im 5/35  bone]
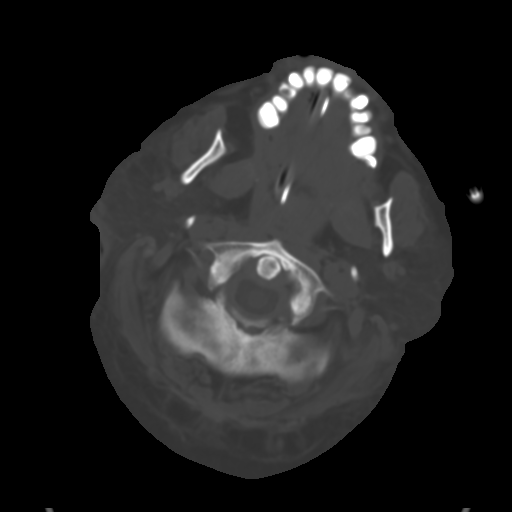
[im 9/35  brain]
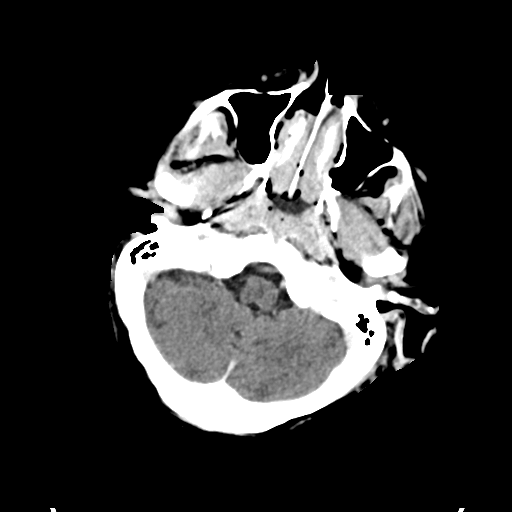
[im 13/35  brain]
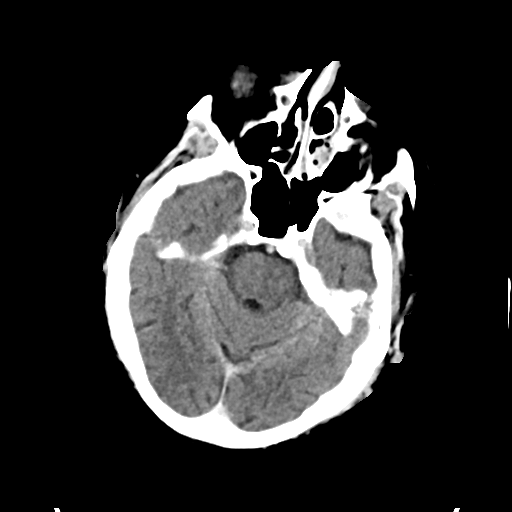
[im 18/35  brain]
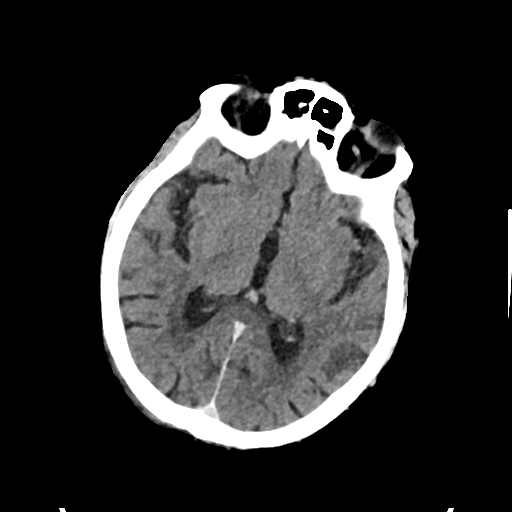
[im 22/35  brain]
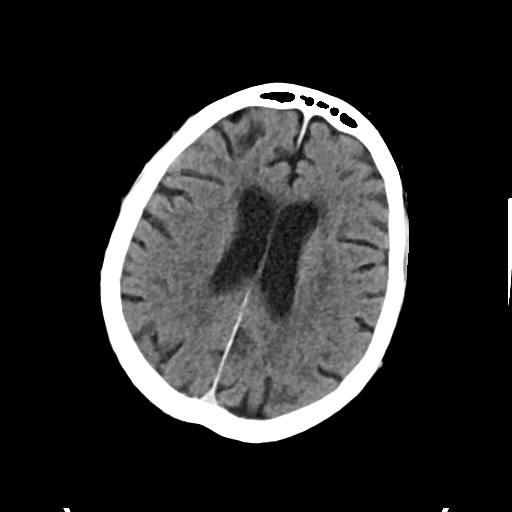
[im 22/35  bone]
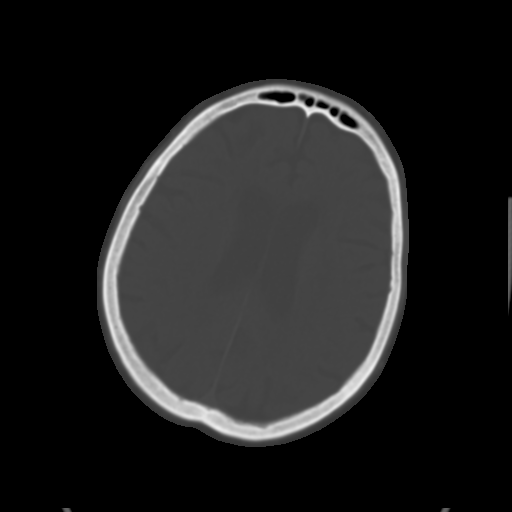
[im 26/35  brain]
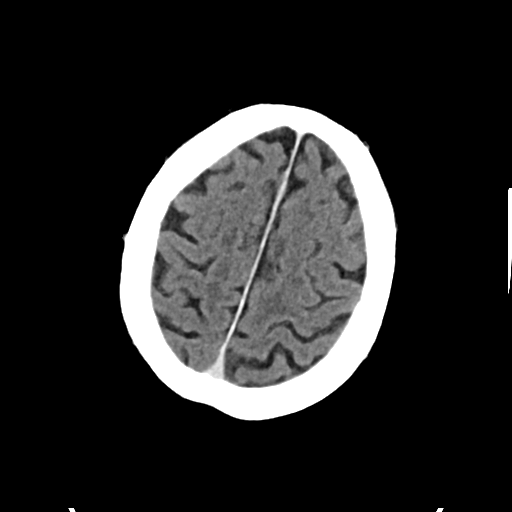
[im 30/35  brain]
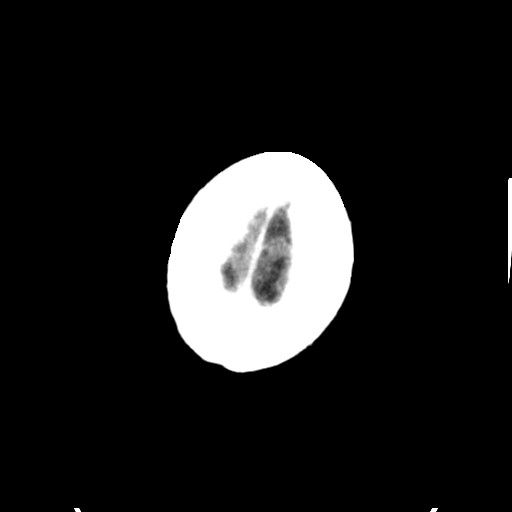

[Series 4: head bone · axial · 0.45mm/px · z∈[-140,-106]mm · 3 of 86 slices shown]
[im 9/86  bone]
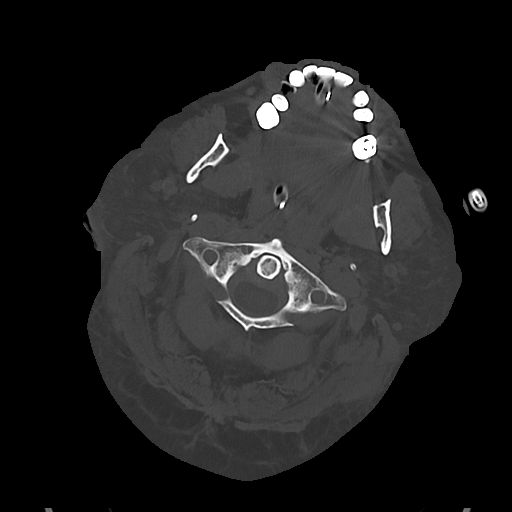
[im 18/86  bone]
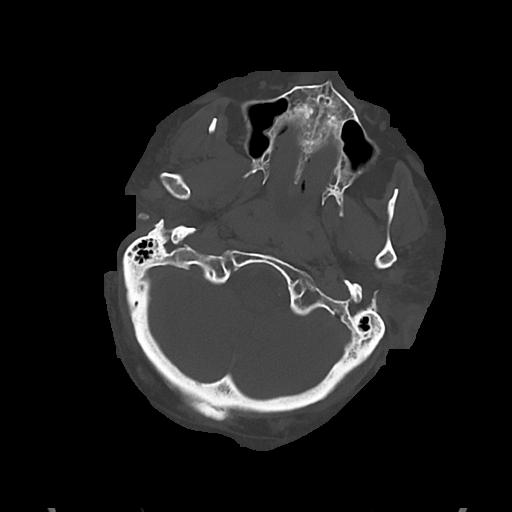
[im 26/86  bone]
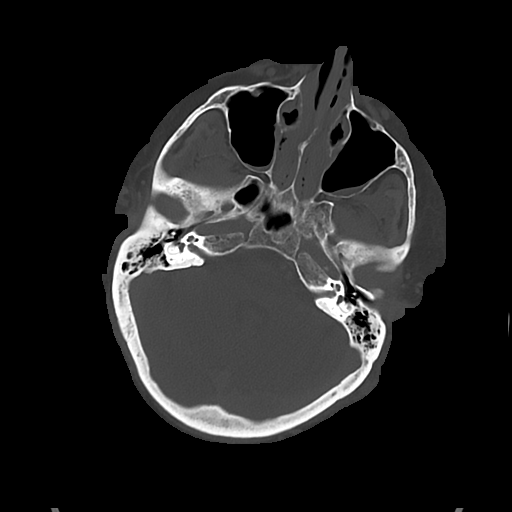

[Series 5: cor soft · coronal · 0.38mm/px · 3 of 74 slices shown]
[im 25/74  brain]
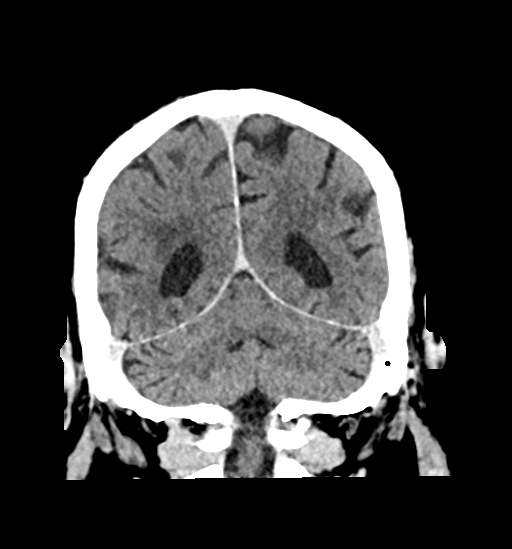
[im 33/74  brain]
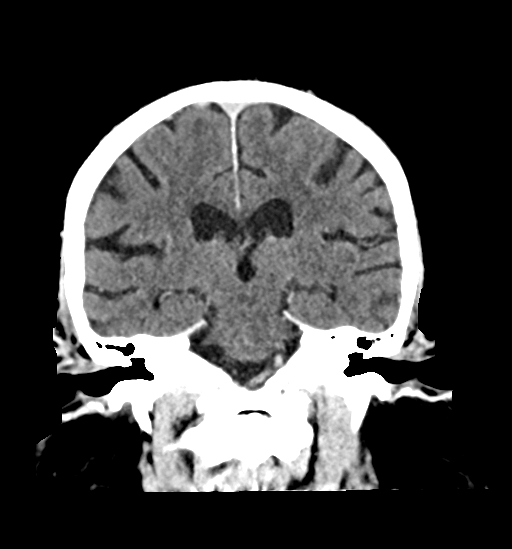
[im 41/74  brain]
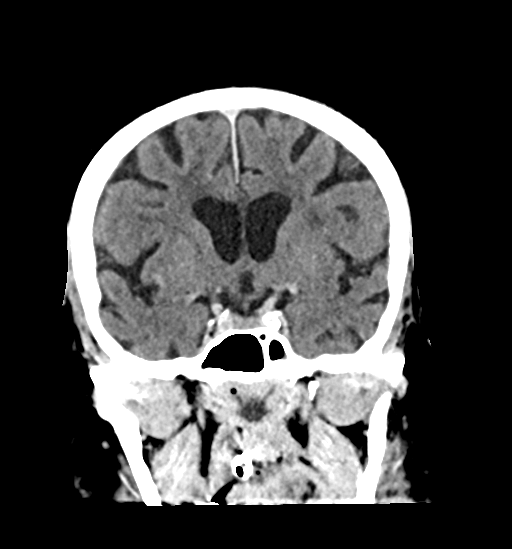

[Series 6: sag soft · sagittal · 0.39mm/px · 3 of 66 slices shown]
[im 22/66  brain]
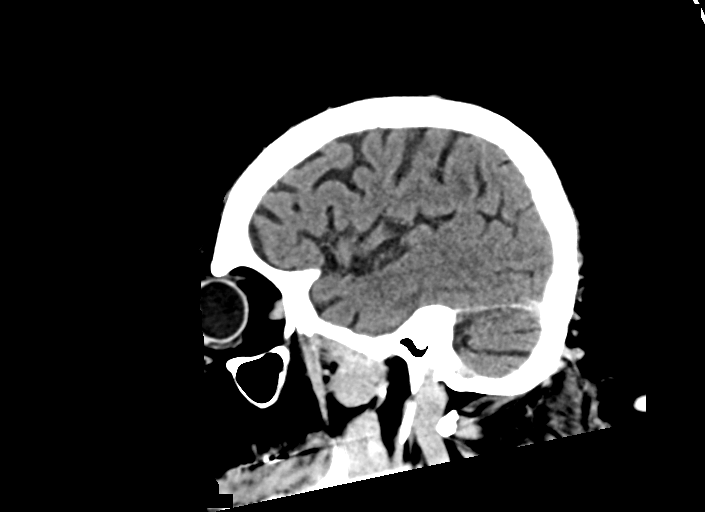
[im 33/66  brain]
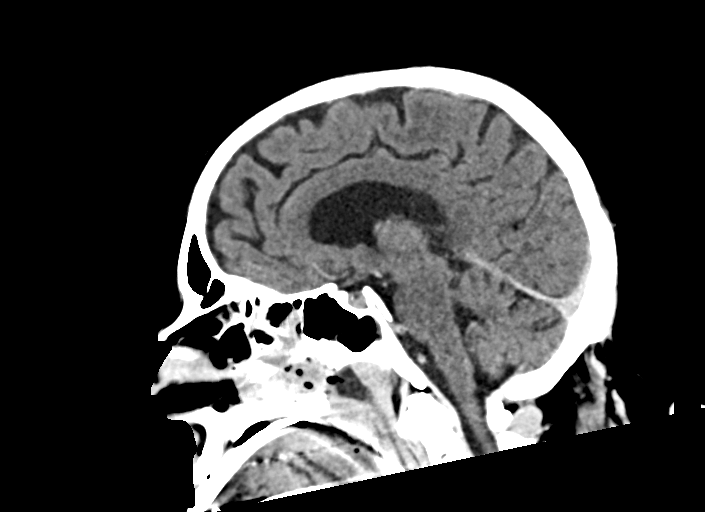
[im 44/66  brain]
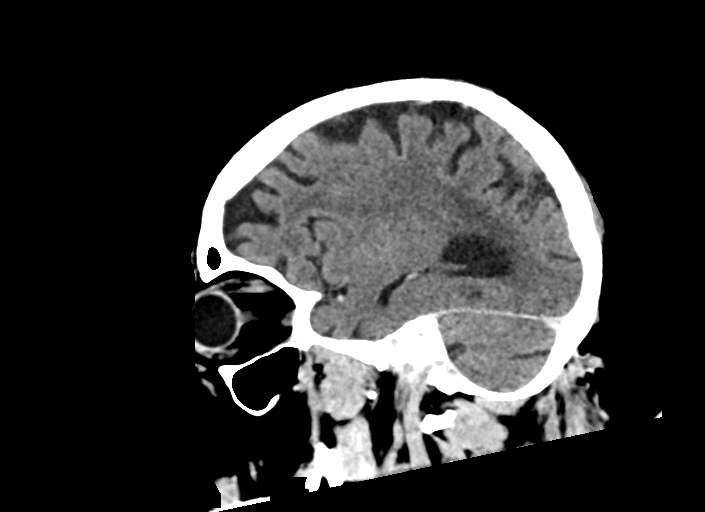

[16 of 47 positions shown; findings below may reference images not displayed]

FINDINGS: Brain: Scattered hypodensities are noted consistent with chronic
white matter ischemic change. Mild atrophic changes are noted
commenced with patient's given age. No findings to suggest acute
hemorrhage, acute infarction or space-occupying mass lesion are
noted.

Vascular: Contrast material is noted within the cerebral arteries
consistent with the recent cardiac catheterization.

Skull: Normal. Negative for fracture or focal lesion.

Sinuses/Orbits: No acute finding.

Other: None.
IMPRESSION: Chronic atrophic and ischemic changes without acute abnormality.
